# Patient Record
Sex: Female | Born: 1950 | Race: Black or African American | Smoking: Former smoker
Health system: Southern US, Community
[De-identification: ages and names within clinical notes are randomized; demographics above are authoritative.]

## PROBLEM LIST (undated history)

## (undated) DIAGNOSIS — M1990 Unspecified osteoarthritis, unspecified site: Secondary | ICD-10-CM

## (undated) HISTORY — PX: ROTATOR CUFF REPAIR: SHX139

## (undated) HISTORY — PX: JOINT REPLACEMENT: SHX530

## (undated) HISTORY — PX: BUNIONECTOMY: SHX129

## (undated) HISTORY — PX: UTERINE FIBROID SURGERY: SHX826

---

## 2012-09-01 ENCOUNTER — Other Ambulatory Visit: Payer: Self-pay

## 2012-09-01 DIAGNOSIS — Z1231 Encounter for screening mammogram for malignant neoplasm of breast: Secondary | ICD-10-CM

## 2012-09-17 ENCOUNTER — Ambulatory Visit
Admission: RE | Admit: 2012-09-17 | Discharge: 2012-09-17 | Disposition: A | Discharge status: Home / Self Care | Payer: Managed Care, Other (non HMO) | Source: Ambulatory Visit

## 2012-09-17 DIAGNOSIS — Z1231 Encounter for screening mammogram for malignant neoplasm of breast: Secondary | ICD-10-CM

## 2012-11-24 ENCOUNTER — Encounter: Payer: Managed Care, Other (non HMO) | Attending: Family Medicine | Admitting: Dietician

## 2012-11-24 ENCOUNTER — Encounter: Payer: Self-pay | Admitting: Dietician

## 2012-11-24 VITALS — Ht 59.0 in | Wt 170.4 lb

## 2012-11-24 DIAGNOSIS — Z713 Dietary counseling and surveillance: Secondary | ICD-10-CM | POA: Insufficient documentation

## 2012-11-24 DIAGNOSIS — E669 Obesity, unspecified: Secondary | ICD-10-CM | POA: Insufficient documentation

## 2012-11-24 NOTE — Patient Instructions (Addendum)
Consider eat three meals and 2 snacks if needed. Consider having oatmeal, boiled egg, or protein shake before leaving house. Consider bringing a snack for midmorning. Consider doing group exercise classes or exercise with a friend or daughter for motivation.  Consider making meals on the weekend or with daughter when she prepares meals. Use MyPlate as guideline for food portions.

## 2012-11-24 NOTE — Progress Notes (Signed)
  Medical Nutrition Therapy:  Appt start time: 1500 end time:  1600.   Assessment:  Primary concerns today. Concerned that weight has gone up. Moved here two years ago and weight was 140 lbs, which was her weight for a long time. 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 Does a lot of "eating and sleeping" on the weekends she is not working.  Lives at home with daughter and grandson and splits grocery shopping with daughter. Daughter is in the process of losing weight and fixes her own food, though they do not eat together since daughter works out and goes to class at night.   Previously worked in [employer] and had time to work out and watch what she ate. Now, feeling too tired after work to make time for exercise or preparing meals. Watches TV at home after work until bed. Eats food at kitchen table, though grazes instead of eating a specific dinner meal. Eats breakfast and lunch at the hospital and has to taste a lot of the foods she prepares, though sometimes skips meals if feeling too tired to eat.   Discussed making some time at home to prepare meals in advance or with her daughter to bring to work, eating protein, fiber, and lean protein with each meal, eating breakfast at home before work and bringing a snack for midmorning. Also discussed finding motivation for physical activity such as working out with a buddy or trying group exercise classes.   MEDICATIONS: See List   DIETARY INTAKE:  24-hr recall:  B (6 AM): Sausage, bacon, potatoes, and whatever is being served for breakfast with coffeeat the hospital   Snk ( AM): none  L ( PM): anything she can grab, pizza, chicken, vegetables, fish from hospital with water Snk ( PM): none D ( PM): potato chips, cookies, coca cola, sandwich with [place] Snk ( PM): grazes at night from time she gets home at work Beverages: coca cola, water throughout the day, coffee in  morning  Usual physical activity: works as a Financial risk analyst so on feet while working walks 15,000 steps per day on the job, no additional activity, belongs to the gym but does not go  Estimated energy needs: 1400 calories 158 g carbohydrates 105 g protein 39 g fat  Progress Towards Goal(s):  In progress.   Nutritional Diagnosis:  NB-1.1 Food and nutrition-related knowledge deficit As related to meal skipping, lack of structured meals, and energy dense snack choices.  As evidenced by 30 pound weight gain in past two years. .    Intervention:  Nutrition counseling. Plan includes:  Consider eat three meals and 2 snacks if needed. Consider having oatmeal, boiled egg, or protein shake before leaving house. Consider bringing a snack for midmorning. Consider doing group exercise classes or exercise with a friend or daughter for motivation.  Consider making meals on the weekend or with daughter when she prepares meals. Use MyPlate as guideline for food portions.   Handouts given during visit include:  MyPlate (2 handouts)   Group Exercise Class List for [facility] Employees  15 g CHO Snack Ideas  Monitoring/Evaluation:  Dietary intake, exercise, and body weight in 6 week(s).

## 2013-01-07 ENCOUNTER — Ambulatory Visit: Payer: Managed Care, Other (non HMO) | Admitting: *Deleted

## 2013-01-12 ENCOUNTER — Ambulatory Visit: Payer: Managed Care, Other (non HMO) | Admitting: *Deleted

## 2013-03-17 ENCOUNTER — Ambulatory Visit: Payer: Managed Care, Other (non HMO) | Attending: Family Medicine | Admitting: Physical Therapy

## 2013-03-17 DIAGNOSIS — M25669 Stiffness of unspecified knee, not elsewhere classified: Secondary | ICD-10-CM | POA: Insufficient documentation

## 2013-03-17 DIAGNOSIS — IMO0001 Reserved for inherently not codable concepts without codable children: Secondary | ICD-10-CM | POA: Insufficient documentation

## 2013-03-17 DIAGNOSIS — M545 Low back pain, unspecified: Secondary | ICD-10-CM | POA: Insufficient documentation

## 2013-03-30 ENCOUNTER — Ambulatory Visit: Payer: Managed Care, Other (non HMO) | Admitting: Physical Therapy

## 2013-04-05 ENCOUNTER — Ambulatory Visit: Payer: Managed Care, Other (non HMO) | Attending: Family Medicine | Admitting: Physical Therapy

## 2013-04-05 DIAGNOSIS — M25669 Stiffness of unspecified knee, not elsewhere classified: Secondary | ICD-10-CM | POA: Insufficient documentation

## 2013-04-05 DIAGNOSIS — IMO0001 Reserved for inherently not codable concepts without codable children: Secondary | ICD-10-CM | POA: Insufficient documentation

## 2013-04-05 DIAGNOSIS — M545 Low back pain, unspecified: Secondary | ICD-10-CM | POA: Insufficient documentation

## 2013-04-07 ENCOUNTER — Ambulatory Visit: Payer: Managed Care, Other (non HMO) | Admitting: Physical Therapy

## 2013-04-12 ENCOUNTER — Ambulatory Visit: Payer: Managed Care, Other (non HMO) | Admitting: Physical Therapy

## 2013-04-14 ENCOUNTER — Ambulatory Visit: Payer: Managed Care, Other (non HMO) | Admitting: Physical Therapy

## 2013-04-19 ENCOUNTER — Ambulatory Visit: Payer: Managed Care, Other (non HMO) | Admitting: Physical Therapy

## 2013-04-21 ENCOUNTER — Ambulatory Visit: Payer: Managed Care, Other (non HMO) | Admitting: Physical Therapy

## 2013-04-26 ENCOUNTER — Ambulatory Visit: Payer: Managed Care, Other (non HMO) | Admitting: Physical Therapy

## 2013-09-01 ENCOUNTER — Other Ambulatory Visit (HOSPITAL_COMMUNITY)
Admission: RE | Admit: 2013-09-01 | Discharge: 2013-09-01 | Disposition: A | Discharge status: Home / Self Care | Payer: Managed Care, Other (non HMO) | Source: Ambulatory Visit | Attending: Obstetrics and Gynecology | Admitting: Obstetrics and Gynecology

## 2013-09-01 ENCOUNTER — Other Ambulatory Visit: Payer: Self-pay | Admitting: Obstetrics and Gynecology

## 2013-09-01 DIAGNOSIS — Z1151 Encounter for screening for human papillomavirus (HPV): Secondary | ICD-10-CM | POA: Insufficient documentation

## 2013-09-01 DIAGNOSIS — Z01419 Encounter for gynecological examination (general) (routine) without abnormal findings: Secondary | ICD-10-CM | POA: Insufficient documentation

## 2013-10-26 ENCOUNTER — Other Ambulatory Visit: Payer: Self-pay

## 2013-10-26 DIAGNOSIS — Z1231 Encounter for screening mammogram for malignant neoplasm of breast: Secondary | ICD-10-CM

## 2013-10-29 ENCOUNTER — Ambulatory Visit
Admission: RE | Admit: 2013-10-29 | Discharge: 2013-10-29 | Disposition: A | Discharge status: Home / Self Care | Payer: Managed Care, Other (non HMO) | Source: Ambulatory Visit

## 2013-10-29 DIAGNOSIS — Z1231 Encounter for screening mammogram for malignant neoplasm of breast: Secondary | ICD-10-CM

## 2014-09-02 ENCOUNTER — Other Ambulatory Visit (HOSPITAL_COMMUNITY)
Admission: RE | Admit: 2014-09-02 | Discharge: 2014-09-02 | Disposition: A | Discharge status: Home / Self Care | Payer: Managed Care, Other (non HMO) | Source: Ambulatory Visit | Attending: Obstetrics and Gynecology | Admitting: Obstetrics and Gynecology

## 2014-09-02 ENCOUNTER — Other Ambulatory Visit: Payer: Self-pay | Admitting: Obstetrics and Gynecology

## 2014-09-02 DIAGNOSIS — Z01419 Encounter for gynecological examination (general) (routine) without abnormal findings: Secondary | ICD-10-CM | POA: Insufficient documentation

## 2014-09-06 LAB — CYTOLOGY - PAP

## 2014-10-24 ENCOUNTER — Other Ambulatory Visit: Payer: Self-pay

## 2014-10-24 DIAGNOSIS — Z1231 Encounter for screening mammogram for malignant neoplasm of breast: Secondary | ICD-10-CM

## 2014-11-02 ENCOUNTER — Ambulatory Visit
Admission: RE | Admit: 2014-11-02 | Discharge: 2014-11-02 | Disposition: A | Discharge status: Home / Self Care | Payer: Managed Care, Other (non HMO) | Source: Ambulatory Visit

## 2014-11-02 DIAGNOSIS — Z1231 Encounter for screening mammogram for malignant neoplasm of breast: Secondary | ICD-10-CM

## 2016-01-30 ENCOUNTER — Other Ambulatory Visit: Payer: Self-pay | Admitting: Family Medicine

## 2016-01-30 DIAGNOSIS — Z1231 Encounter for screening mammogram for malignant neoplasm of breast: Secondary | ICD-10-CM

## 2016-02-07 ENCOUNTER — Ambulatory Visit
Admission: RE | Admit: 2016-02-07 | Discharge: 2016-02-07 | Disposition: A | Discharge status: Home / Self Care | Payer: Managed Care, Other (non HMO) | Source: Ambulatory Visit | Attending: Family Medicine | Admitting: Family Medicine

## 2016-02-07 DIAGNOSIS — Z1231 Encounter for screening mammogram for malignant neoplasm of breast: Secondary | ICD-10-CM

## 2016-02-29 ENCOUNTER — Ambulatory Visit: Payer: Self-pay | Admitting: Orthopedic Surgery

## 2016-03-05 ENCOUNTER — Ambulatory Visit: Payer: Self-pay | Admitting: Orthopedic Surgery

## 2016-03-05 NOTE — H&P (Signed)
TOTAL HIP ADMISSION H&P  Patient is admitted for right total hip arthroplasty.  Subjective:  Chief Complaint: right hip pain  HPI: Paula Hardy, 65 y.o. female, has a history of pain and functional disability in the right hip(s) due to arthritis and patient has failed non-surgical conservative treatments for greater than 12 weeks to include NSAID's and/or analgesics, flexibility and strengthening excercises, use of assistive devices, weight reduction as appropriate and activity modification.  Onset of symptoms was gradual starting 1 years ago with rapidlly worsening course since that time.The patient noted no past surgery on the right hip(s).  Patient currently rates pain in the right hip at 10 out of 10 with activity. Patient has night pain, worsening of pain with activity and weight bearing, pain that interfers with activities of daily living and pain with passive range of motion. Patient has evidence of subchondral cysts, subchondral sclerosis, periarticular osteophytes and joint space narrowing by imaging studies. This condition presents safety issues increasing the risk of falls. There is no current active infection.  There are no active problems to display for this patient.  Past Medical History:  Diagnosis Date  . Fibromyalgia     No past surgical history on file.   (Not in a hospital admission) No Known Allergies  Social History  Substance Use Topics  . Smoking status: Not on file  . Smokeless tobacco: Not on file  . Alcohol use Not on file    No family history on file.   Review of Systems  Constitutional: Negative.   HENT: Negative.   Eyes: Negative.   Respiratory: Negative.   Cardiovascular: Negative.   Gastrointestinal: Negative.   Genitourinary: Negative.   Musculoskeletal: Positive for back pain and joint pain.  Skin: Negative.   Neurological: Negative.   Endo/Heme/Allergies: Negative.   Psychiatric/Behavioral: Negative.     Objective:  Physical Exam   Vitals reviewed. Constitutional: She is oriented to person, place, and time. She appears well-developed and well-nourished.  HENT:  Head: Normocephalic and atraumatic.  Eyes: Conjunctivae and EOM are normal. Pupils are equal, round, and reactive to light.  Neck: Normal range of motion. Neck supple.  Cardiovascular: Normal rate and regular rhythm.   Respiratory: Effort normal. No respiratory distress.  GI: Soft. She exhibits no distension.  Genitourinary:  Genitourinary Comments: deferred  Musculoskeletal:       Right hip: She exhibits decreased range of motion, decreased strength and bony tenderness.  Neurological: She is alert and oriented to person, place, and time. She has normal reflexes.  Skin: Skin is warm and dry.  Psychiatric: She has a normal mood and affect. Her behavior is normal. Judgment and thought content normal.    Vital signs in last 24 hours: @VSRANGES@  Labs:   Estimated body mass index is 34.42 kg/m as calculated from the following:   Height as of 11/24/12: 4' 11" (1.499 m).   Weight as of 11/24/12: 77.3 kg (170 lb 6.4 oz).   Imaging Review Plain radiographs demonstrate severe degenerative joint disease of the right hip(s). The bone quality appears to be adequate for age and reported activity level.  Assessment/Plan:  End stage arthritis, right hip(s)  The patient history, physical examination, clinical judgement of the provider and imaging studies are consistent with end stage degenerative joint disease of the right hip(s) and total hip arthroplasty is deemed medically necessary. The treatment options including medical management, injection therapy, arthroscopy and arthroplasty were discussed at length. The risks and benefits of total hip arthroplasty were presented   and reviewed. The risks due to aseptic loosening, infection, stiffness, dislocation/subluxation,  thromboembolic complications and other imponderables were discussed.  The patient acknowledged the  explanation, agreed to proceed with the plan and consent was signed. Patient is being admitted for inpatient treatment for surgery, pain control, PT, OT, prophylactic antibiotics, VTE prophylaxis, progressive ambulation and ADL's and discharge planning.The patient is planning to be discharged home with home health services

## 2016-03-12 NOTE — Patient Instructions (Signed)
Paula DavidsonCarlis Hardy  03/12/2016   Your procedure is scheduled on: 03/21/2016    [facility]  [facility] to 3rd floor to  Short Stay Center at   1045 AM.  Call this number if you have problems the morning of surgery [phone]   Remember: ONLY 1 PERSON MAY GO WITH YOU TO SHORT STAY TO GET  READY MORNING OF YOUR SURGERY.  Do not eat food or drink liquids :After Midnight.     Take these medicines the morning of surgery with A SIP OF WATER: none                                 You may not have any metal on your body including hair pins and              piercings  Do not wear jewelry, make-up, lotions, powders or perfumes, deodorant             Do not wear nail polish.  Do not shave  48 hours prior to surgery.              Men may shave face and neck.   Do not bring valuables to the hospital. [place] IS NOT             RESPONSIBLE   FOR VALUABLES.  Contacts, dentures or bridgework may not be worn into surgery.  Leave suitcase in the car. After surgery it may be brought to your room.         Special Instructions: coughing and deep breathing exercises,leg exercises               Please read over the following fact sheets you were given: _____________________________________________________________________             [facility] - Preparing for [facility] Before surgery, you can play an important role.  Because skin is not sterile, your skin needs to be as free of germs as possible.  You can reduce the number of germs on your skin by washing with CHG (chlorahexidine gluconate) soap before surgery.  CHG is an antiseptic cleaner which kills germs and bonds with the skin to continue killing germs even after washing. Please DO NOT use if you have an allergy to CHG or antibacterial soaps.  If your skin becomes reddened/irritated stop using the CHG and inform your nurse when you arrive at Short Stay. Do not shave (including legs and  underarms) for at least 48 hours prior to the first CHG shower.  You may shave your face/neck. Please follow these instructions carefully:  1.  Shower with CHG Soap the night before surgery and the  morning of Surgery.  2.  If you choose to wash your hair, wash your hair first as usual with your  normal  shampoo.  3.  After you shampoo, rinse your hair and body thoroughly to remove the  shampoo.                           4.  Use CHG as you would any other liquid soap.  You can apply chg directly  to the skin and wash  Gently with a scrungie or clean washcloth.  5.  Apply the CHG Soap to your body ONLY FROM THE NECK DOWN.   Do not use on face/ open                           Wound or open sores. Avoid contact with eyes, ears mouth and genitals (private parts).                       Wash face,  Genitals (private parts) with your normal soap.             6.  Wash thoroughly, paying special attention to the area where your surgery  will be performed.  7.  Thoroughly rinse your body with warm water from the neck down.  8.  DO NOT shower/wash with your normal soap after using and rinsing off  the CHG Soap.                9.  Pat yourself dry with a clean towel.            10.  Wear clean pajamas.            11.  Place clean sheets on your bed the night of your first shower and do not  sleep with pets. Day of Surgery : Do not apply any lotions/deodorants the morning of surgery.  Please wear clean clothes to the hospital/surgery center.  FAILURE TO FOLLOW THESE INSTRUCTIONS MAY RESULT IN THE CANCELLATION OF YOUR SURGERY PATIENT SIGNATURE_________________________________  NURSE SIGNATURE__________________________________  ________________________________________________________________________  WHAT IS A BLOOD TRANSFUSION? Blood Transfusion Information  A transfusion is the replacement of blood or some of its parts. Blood is made up of multiple cells which provide different  functions.  Red blood cells carry oxygen and are used for blood loss replacement.  White blood cells fight against infection.  Platelets control bleeding.  Plasma helps clot blood.  Other blood products are available for specialized needs, such as hemophilia or other clotting disorders. BEFORE THE TRANSFUSION  Who gives blood for transfusions?   Healthy volunteers who are fully evaluated to make sure their blood is safe. This is blood bank blood. Transfusion therapy is the safest it has ever been in the practice of medicine. Before blood is taken from a donor, a complete history is taken to make sure that person has no history of diseases nor engages in risky social behavior (examples are intravenous drug use or sexual activity with multiple partners). The donor's travel history is screened to minimize risk of transmitting infections, such as malaria. The donated blood is tested for signs of infectious diseases, such as HIV and hepatitis. The blood is then tested to be sure it is compatible with you in order to minimize the chance of a transfusion reaction. If you or a relative donates blood, this is often done in anticipation of surgery and is not appropriate for emergency situations. It takes many days to process the donated blood. RISKS AND COMPLICATIONS Although transfusion therapy is very safe and saves many lives, the main dangers of transfusion include:   Getting an infectious disease.  Developing a transfusion reaction. This is an allergic reaction to something in the blood you were given. Every precaution is taken to prevent this. The decision to have a blood transfusion has been considered carefully by your caregiver before blood is given. Blood is not given unless the benefits outweigh  the risks. AFTER THE TRANSFUSION  Right after receiving a blood transfusion, you will usually feel much better and more energetic. This is especially true if your red blood cells have gotten low  (anemic). The transfusion raises the level of the red blood cells which carry oxygen, and this usually causes an energy increase.  The nurse administering the transfusion will monitor you carefully for complications. HOME CARE INSTRUCTIONS  No special instructions are needed after a transfusion. You may find your energy is better. Speak with your caregiver about any limitations on activity for underlying diseases you may have. SEEK MEDICAL CARE IF:   Your condition is not improving after your transfusion.  You develop redness or irritation at the intravenous (IV) site. SEEK IMMEDIATE MEDICAL CARE IF:  Any of the following symptoms occur over the next 12 hours:  Shaking chills.  You have a temperature by mouth above 102 F (38.9 C), not controlled by medicine.  Chest, back, or muscle pain.  People around you feel you are not acting correctly or are confused.  Shortness of breath or difficulty breathing.  Dizziness and fainting.  You get a rash or develop hives.  You have a decrease in urine output.  Your urine turns a dark color or changes to pink, red, or brown. Any of the following symptoms occur over the next 10 days:  You have a temperature by mouth above 102 F (38.9 C), not controlled by medicine.  Shortness of breath.  Weakness after normal activity.  The white part of the eye turns yellow (jaundice).  You have a decrease in the amount of urine or are urinating less often.  Your urine turns a dark color or changes to pink, red, or brown. Document Released: 04/19/2000 Document Revised: 07/15/2011 Document Reviewed: 12/07/2007 ExitCare Patient Information 2014 [place].  _______________________________________________________________________  Incentive Spirometer  An incentive spirometer is a tool that can help keep your lungs clear and active. This tool measures how well you are filling your lungs with each breath. Taking long deep breaths may help  reverse or decrease the chance of developing breathing (pulmonary) problems (especially infection) following:  A long period of time when you are unable to move or be active. BEFORE THE PROCEDURE   If the spirometer includes an indicator to show your best effort, your nurse or respiratory therapist will set it to a desired goal.  If possible, sit up straight or lean slightly forward. Try not to slouch.  Hold the incentive spirometer in an upright position. INSTRUCTIONS FOR USE  1. Sit on the edge of your bed if possible, or sit up as far as you can in bed or on a chair. 2. Hold the incentive spirometer in an upright position. 3. Breathe out normally. 4. Place the mouthpiece in your mouth and seal your lips tightly around it. 5. Breathe in slowly and as deeply as possible, raising the piston or the ball toward the top of the column. 6. Hold your breath for 3-5 seconds or for as long as possible. Allow the piston or ball to fall to the bottom of the column. 7. Remove the mouthpiece from your mouth and breathe out normally. 8. Rest for a few seconds and repeat Steps 1 through 7 at least 10 times every 1-2 hours when you are awake. Take your time and take a few normal breaths between deep breaths. 9. The spirometer may include an indicator to show your best effort. Use the indicator as a goal to work  toward during each repetition. 10. After each set of 10 deep breaths, practice coughing to be sure your lungs are clear. If you have an incision (the cut made at the time of surgery), support your incision when coughing by placing a pillow or rolled up towels firmly against it. Once you are able to get out of bed, walk around indoors and cough well. You may stop using the incentive spirometer when instructed by your caregiver.  RISKS AND COMPLICATIONS  Take your time so you do not get dizzy or light-headed.  If you are in pain, you may need to take or ask for pain medication before doing incentive  spirometry. It is harder to take a deep breath if you are having pain. AFTER USE  Rest and breathe slowly and easily.  It can be helpful to keep track of a log of your progress. Your caregiver can provide you with a simple table to help with this. If you are using the spirometer at home, follow these instructions: [place] IF:   You are having difficultly using the spirometer.  You have trouble using the spirometer as often as instructed.  Your pain medication is not giving enough relief while using the spirometer.  You develop fever of 100.5 F (38.1 C) or higher. SEEK IMMEDIATE MEDICAL CARE IF:   You cough up bloody sputum that had not been present before.  You develop fever of 102 F (38.9 C) or greater.  You develop worsening pain at or near the incision site. MAKE SURE YOU:   Understand these instructions.  Will watch your condition.  Will get help right away if you are not doing well or get worse. Document Released: 09/02/2006 Document Revised: 07/15/2011 Document Reviewed: 11/03/2006 [facility] Patient Information 2014 [place], [place].   ________________________________________________________________________

## 2016-03-13 ENCOUNTER — Encounter (HOSPITAL_COMMUNITY): Payer: Self-pay | Admitting: *Deleted

## 2016-03-13 ENCOUNTER — Encounter (HOSPITAL_COMMUNITY)
Admission: RE | Admit: 2016-03-13 | Discharge: 2016-03-13 | Disposition: A | Discharge status: Home / Self Care | Payer: Managed Care, Other (non HMO) | Source: Ambulatory Visit | Attending: Orthopedic Surgery | Admitting: Orthopedic Surgery

## 2016-03-13 DIAGNOSIS — Z01810 Encounter for preprocedural cardiovascular examination: Secondary | ICD-10-CM | POA: Diagnosis not present

## 2016-03-13 DIAGNOSIS — Z01812 Encounter for preprocedural laboratory examination: Secondary | ICD-10-CM | POA: Diagnosis present

## 2016-03-13 HISTORY — DX: Unspecified osteoarthritis, unspecified site: M19.90

## 2016-03-13 LAB — BASIC METABOLIC PANEL
Anion gap: 6 (ref 5–15)
BUN: 14 mg/dL (ref 6–20)
CO2: 28 mmol/L (ref 22–32)
CREATININE: 0.65 mg/dL (ref 0.44–1.00)
Calcium: 9.1 mg/dL (ref 8.9–10.3)
Chloride: 106 mmol/L (ref 101–111)
GFR calc Af Amer: >60 mL/min (ref >60)
GLUCOSE: 91 mg/dL (ref 65–99)
Potassium: 4.5 mmol/L (ref 3.5–5.1)
SODIUM: 140 mmol/L (ref 135–145)

## 2016-03-13 LAB — CBC
HCT: 41.8 % (ref 36.0–46.0)
Hemoglobin: 13.5 g/dL (ref 12.0–15.0)
MCH: 28 pg (ref 26.0–34.0)
MCHC: 32.3 g/dL (ref 30.0–36.0)
MCV: 86.5 fL (ref 78.0–100.0)
PLATELETS: 354 10*3/uL (ref 150–400)
RBC: 4.83 MIL/uL (ref 3.87–5.11)
RDW: 13.8 % (ref 11.5–15.5)
WBC: 7.9 10*3/uL (ref 4.0–10.5)

## 2016-03-13 LAB — ABO/RH: ABO/RH(D): O POS

## 2016-03-13 LAB — SURGICAL PCR SCREEN
MRSA, PCR: NEGATIVE
Staphylococcus aureus: NEGATIVE

## 2016-03-13 NOTE — Progress Notes (Addendum)
At preop appointment noted on dinemapp pulse - 30 .  Patient voiced no complaints.  B/P - 157/63.  Sat- 100.  Manual radial pulse noted to be irregular at 56 with / skipping beats.  Patient denies any chest pain or shortness of breath.  Patient stated she had EKG done 01/2016 at PCP- Dr Juluis RainierElizabeth Barnes who cleared her for surgery.  Clearance from Dr Zachery DauerBarnes on chart dated 01/2016.  Called and requested EKG from Dr Zachery DauerBarnes.  EKG done at preop.  EKgs shown to Dr Sampson GoonFitzgerald ( anesthesia).  Made anesthesia aware of above along with medical history.  Dr Sampson GoonFitzgerald stated patient will need cardiac clearance prior to surgery  Patient made aware of this.  Patient also verbalized understanding that if she has any chest pain or shortness of breath to go to Emergency Room.  I informed patient that I would be [facility] and make them aware.  I called Halford DecampLaurie Faust and made aware of above.  EKG from 01/2016 and EKG from 03/13/16 fax[facility]aur[facility]  Halford DecampLaurie Faust stated she would inform Dr Linna CapriceSwinteck.  Confirmation fax received.

## 2016-03-13 NOTE — Progress Notes (Signed)
Clearance- Dr Zachery DauerBarnes- 01/2016 on chart along with EKG from 01/2016 on chart.   [facility],DDS 02/26/2016 on chart.

## 2016-03-14 NOTE — Progress Notes (Signed)
Final EKG done 03/13/16- [facility]  

## 2016-03-15 NOTE — Progress Notes (Addendum)
Cardiac Clearance on chart- dated 03/15/16 from Ddr Ganji. Requested LOV note and EKG from visit of 03/15/16 at Dr Jacinto HalimGanji.  They are to fax.

## 2016-03-19 NOTE — Progress Notes (Signed)
Received and placed on chart Clearance dated 03/15/2016 on chart EKG done 11/10/176 and office visit of 03/15/2016 with Dr Jacinto HalimGanji.

## 2016-03-21 ENCOUNTER — Inpatient Hospital Stay (HOSPITAL_COMMUNITY): Payer: Managed Care, Other (non HMO)

## 2016-03-21 ENCOUNTER — Encounter (HOSPITAL_COMMUNITY): Payer: Self-pay | Admitting: *Deleted

## 2016-03-21 ENCOUNTER — Encounter (HOSPITAL_COMMUNITY)
Admission: RE | Disposition: A | Discharge status: Home Health | Payer: Self-pay | Source: Ambulatory Visit | Attending: Orthopedic Surgery

## 2016-03-21 ENCOUNTER — Inpatient Hospital Stay (HOSPITAL_COMMUNITY)
Admission: RE | Admit: 2016-03-21 | Discharge: 2016-03-22 | DRG: 470 | Disposition: A | Discharge status: Home Health | Payer: Managed Care, Other (non HMO) | Source: Ambulatory Visit | Attending: Orthopedic Surgery | Admitting: Orthopedic Surgery

## 2016-03-21 ENCOUNTER — Inpatient Hospital Stay (HOSPITAL_COMMUNITY): Payer: Managed Care, Other (non HMO) | Admitting: Certified Registered Nurse Anesthetist

## 2016-03-21 DIAGNOSIS — Z09 Encounter for follow-up examination after completed treatment for conditions other than malignant neoplasm: Secondary | ICD-10-CM

## 2016-03-21 DIAGNOSIS — M25551 Pain in right hip: Secondary | ICD-10-CM | POA: Diagnosis present

## 2016-03-21 DIAGNOSIS — M1611 Unilateral primary osteoarthritis, right hip: Principal | ICD-10-CM | POA: Diagnosis present

## 2016-03-21 DIAGNOSIS — Z87891 Personal history of nicotine dependence: Secondary | ICD-10-CM | POA: Diagnosis not present

## 2016-03-21 DIAGNOSIS — M25559 Pain in unspecified hip: Secondary | ICD-10-CM

## 2016-03-21 DIAGNOSIS — M797 Fibromyalgia: Secondary | ICD-10-CM | POA: Diagnosis present

## 2016-03-21 HISTORY — PX: TOTAL HIP ARTHROPLASTY: SHX124

## 2016-03-21 LAB — TYPE AND SCREEN
ABO/RH(D): O POS
ANTIBODY SCREEN: NEGATIVE

## 2016-03-21 SURGERY — ARTHROPLASTY, HIP, TOTAL, ANTERIOR APPROACH
Anesthesia: Monitor Anesthesia Care | Site: Hip | Laterality: Right

## 2016-03-21 MED ORDER — PROMETHAZINE HCL 25 MG/ML IJ SOLN: 6.2500 mg | INTRAMUSCULAR | Status: DC | PRN

## 2016-03-21 MED ORDER — DIPHENHYDRAMINE HCL 12.5 MG/5ML PO ELIX: 12.5000 mg | ORAL_SOLUTION | ORAL | Status: DC | PRN

## 2016-03-21 MED ORDER — ISOPROPYL ALCOHOL 70 % SOLN
Status: AC
  Filled 2016-03-21: qty 480

## 2016-03-21 MED ORDER — ISOPROPYL ALCOHOL 70 % SOLN
Status: DC | PRN
  Administered 2016-03-21: 1 via TOPICAL

## 2016-03-21 MED ORDER — WATER FOR IRRIGATION, STERILE IR SOLN
Status: DC | PRN
  Administered 2016-03-21: 3000 mL

## 2016-03-21 MED ORDER — PHENYLEPHRINE HCL 10 MG/ML IJ SOLN
INTRAMUSCULAR | Status: AC
  Filled 2016-03-21: qty 1

## 2016-03-21 MED ORDER — CEFAZOLIN IN D5W 1 GM/50ML IV SOLN
1.0000 g | Freq: Four times a day (QID) | INTRAVENOUS | Status: AC
  Administered 2016-03-21 – 2016-03-22 (×2): 1 g via INTRAVENOUS
  Filled 2016-03-21 (×2): qty 50

## 2016-03-21 MED ORDER — POVIDONE-IODINE 10 % EX SOLN
CUTANEOUS | Status: DC | PRN
  Administered 2016-03-21: 1 via TOPICAL

## 2016-03-21 MED ORDER — BUPIVACAINE HCL (PF) 0.25 % IJ SOLN
INTRAMUSCULAR | Status: AC
  Filled 2016-03-21: qty 30

## 2016-03-21 MED ORDER — ACETAMINOPHEN 325 MG PO TABS: 650.0000 mg | ORAL_TABLET | Freq: Four times a day (QID) | ORAL | Status: DC | PRN

## 2016-03-21 MED ORDER — PHENYLEPHRINE HCL 10 MG/ML IJ SOLN
INTRAVENOUS | Status: DC | PRN
  Administered 2016-03-21: 50 ug/min via INTRAVENOUS

## 2016-03-21 MED ORDER — DEXTROSE 5 % IV SOLN
500.0000 mg | Freq: Four times a day (QID) | INTRAVENOUS | Status: DC | PRN
  Administered 2016-03-21: 500 mg via INTRAVENOUS
  Filled 2016-03-21: qty 550
  Filled 2016-03-21: qty 5

## 2016-03-21 MED ORDER — MIDAZOLAM HCL 2 MG/2ML IJ SOLN
INTRAMUSCULAR | Status: AC
  Filled 2016-03-21: qty 2

## 2016-03-21 MED ORDER — POVIDONE-IODINE 10 % EX SWAB: 2.0000 "application " | Freq: Once | CUTANEOUS | Status: DC

## 2016-03-21 MED ORDER — BUPIVACAINE HCL (PF) 0.5 % IJ SOLN
INTRAMUSCULAR | Status: DC | PRN
  Administered 2016-03-21: 3 mL via INTRATHECAL

## 2016-03-21 MED ORDER — ONDANSETRON HCL 4 MG/2ML IJ SOLN
INTRAMUSCULAR | Status: AC
  Filled 2016-03-21: qty 2

## 2016-03-21 MED ORDER — VITAMIN D3 25 MCG (1000 UNIT) PO TABS
1000.0000 [IU] | ORAL_TABLET | Freq: Every day | ORAL | Status: DC
  Administered 2016-03-22: 1000 [IU] via ORAL
  Filled 2016-03-21: qty 1

## 2016-03-21 MED ORDER — HYDROMORPHONE HCL 1 MG/ML IJ SOLN
INTRAMUSCULAR | Status: AC
  Administered 2016-03-21: 0.5 mg via INTRAVENOUS
  Filled 2016-03-21: qty 1

## 2016-03-21 MED ORDER — ACETAMINOPHEN 10 MG/ML IV SOLN
1000.0000 mg | INTRAVENOUS | Status: AC
  Administered 2016-03-21: 1000 mg via INTRAVENOUS
  Filled 2016-03-21: qty 100

## 2016-03-21 MED ORDER — PROPOFOL 10 MG/ML IV BOLUS
INTRAVENOUS | Status: DC | PRN
  Administered 2016-03-21 (×4): 10 mg via INTRAVENOUS

## 2016-03-21 MED ORDER — DEXAMETHASONE SODIUM PHOSPHATE 10 MG/ML IJ SOLN
10.0000 mg | Freq: Once | INTRAMUSCULAR | Status: AC
  Administered 2016-03-22: 10 mg via INTRAVENOUS
  Filled 2016-03-21: qty 1

## 2016-03-21 MED ORDER — CEFAZOLIN SODIUM-DEXTROSE 2-4 GM/100ML-% IV SOLN
2.0000 g | INTRAVENOUS | Status: AC
  Administered 2016-03-21: 2 g via INTRAVENOUS

## 2016-03-21 MED ORDER — DOCUSATE SODIUM 100 MG PO CAPS
100.0000 mg | ORAL_CAPSULE | Freq: Two times a day (BID) | ORAL | Status: DC
  Administered 2016-03-21 – 2016-03-22 (×2): 100 mg via ORAL
  Filled 2016-03-21 (×2): qty 1

## 2016-03-21 MED ORDER — ONDANSETRON HCL 4 MG/2ML IJ SOLN: 4.0000 mg | Freq: Four times a day (QID) | INTRAMUSCULAR | Status: DC | PRN

## 2016-03-21 MED ORDER — PHENYLEPHRINE 40 MCG/ML (10ML) SYRINGE FOR IV PUSH (FOR BLOOD PRESSURE SUPPORT)
PREFILLED_SYRINGE | INTRAVENOUS | Status: DC | PRN
  Administered 2016-03-21 (×6): 80 ug via INTRAVENOUS

## 2016-03-21 MED ORDER — CHLORHEXIDINE GLUCONATE 4 % EX LIQD: 60.0000 mL | Freq: Once | CUTANEOUS | Status: DC

## 2016-03-21 MED ORDER — 0.9 % SODIUM CHLORIDE (POUR BTL) OPTIME: TOPICAL | Status: DC | PRN

## 2016-03-21 MED ORDER — EPHEDRINE 5 MG/ML INJ
INTRAVENOUS | Status: AC
  Filled 2016-03-21: qty 10

## 2016-03-21 MED ORDER — SODIUM CHLORIDE 0.9 % IJ SOLN
INTRAMUSCULAR | Status: DC | PRN
  Administered 2016-03-21: 29 mL

## 2016-03-21 MED ORDER — HYDROMORPHONE HCL 1 MG/ML IJ SOLN
0.2500 mg | INTRAMUSCULAR | Status: DC | PRN
  Administered 2016-03-21 (×2): 0.5 mg via INTRAVENOUS

## 2016-03-21 MED ORDER — FENTANYL CITRATE (PF) 100 MCG/2ML IJ SOLN
INTRAMUSCULAR | Status: DC | PRN
  Administered 2016-03-21 (×2): 25 ug via INTRAVENOUS
  Administered 2016-03-21: 50 ug via INTRAVENOUS

## 2016-03-21 MED ORDER — MIDAZOLAM HCL 5 MG/5ML IJ SOLN
INTRAMUSCULAR | Status: DC | PRN
  Administered 2016-03-21 (×2): 1 mg via INTRAVENOUS

## 2016-03-21 MED ORDER — ACETAMINOPHEN 10 MG/ML IV SOLN
INTRAVENOUS | Status: AC
  Filled 2016-03-21: qty 100

## 2016-03-21 MED ORDER — TRANEXAMIC ACID 1000 MG/10ML IV SOLN
1000.0000 mg | Freq: Once | INTRAVENOUS | Status: AC
  Administered 2016-03-21: 1000 mg via INTRAVENOUS
  Filled 2016-03-21: qty 10

## 2016-03-21 MED ORDER — CEFAZOLIN SODIUM-DEXTROSE 2-4 GM/100ML-% IV SOLN
INTRAVENOUS | Status: AC
  Filled 2016-03-21: qty 100

## 2016-03-21 MED ORDER — KETOROLAC TROMETHAMINE 30 MG/ML IJ SOLN
INTRAMUSCULAR | Status: AC
  Filled 2016-03-21: qty 1

## 2016-03-21 MED ORDER — PROPOFOL 10 MG/ML IV BOLUS
INTRAVENOUS | Status: AC
  Filled 2016-03-21: qty 20

## 2016-03-21 MED ORDER — SODIUM CHLORIDE 0.9 % IR SOLN
Status: DC | PRN
  Administered 2016-03-21: 1000 mL

## 2016-03-21 MED ORDER — BUPIVACAINE HCL (PF) 0.25 % IJ SOLN
INTRAMUSCULAR | Status: DC | PRN
  Administered 2016-03-21: 30 mL

## 2016-03-21 MED ORDER — KETOROLAC TROMETHAMINE 30 MG/ML IJ SOLN
INTRAMUSCULAR | Status: DC | PRN
  Administered 2016-03-21: 30 mg

## 2016-03-21 MED ORDER — SODIUM CHLORIDE 0.9 % IV SOLN
INTRAVENOUS | Status: DC
  Administered 2016-03-21 – 2016-03-22 (×2): via INTRAVENOUS

## 2016-03-21 MED ORDER — PHENYLEPHRINE 40 MCG/ML (10ML) SYRINGE FOR IV PUSH (FOR BLOOD PRESSURE SUPPORT)
PREFILLED_SYRINGE | INTRAVENOUS | Status: AC
  Filled 2016-03-21: qty 10

## 2016-03-21 MED ORDER — ONDANSETRON HCL 4 MG PO TABS: 4.0000 mg | ORAL_TABLET | Freq: Four times a day (QID) | ORAL | Status: DC | PRN

## 2016-03-21 MED ORDER — METHOCARBAMOL 500 MG PO TABS
500.0000 mg | ORAL_TABLET | Freq: Four times a day (QID) | ORAL | Status: DC | PRN
  Administered 2016-03-22 (×2): 500 mg via ORAL
  Filled 2016-03-21 (×2): qty 1

## 2016-03-21 MED ORDER — BUPIVACAINE HCL (PF) 0.5 % IJ SOLN
INTRAMUSCULAR | Status: AC
  Filled 2016-03-21: qty 30

## 2016-03-21 MED ORDER — METOCLOPRAMIDE HCL 5 MG PO TABS: 5.0000 mg | ORAL_TABLET | Freq: Three times a day (TID) | ORAL | Status: DC | PRN

## 2016-03-21 MED ORDER — PROPOFOL 500 MG/50ML IV EMUL
INTRAVENOUS | Status: DC | PRN
  Administered 2016-03-21: 50 ug/kg/min via INTRAVENOUS

## 2016-03-21 MED ORDER — HYDROGEN PEROXIDE 3 % EX SOLN
CUTANEOUS | Status: AC
  Filled 2016-03-21: qty 473

## 2016-03-21 MED ORDER — HYDROMORPHONE HCL 1 MG/ML IJ SOLN
0.5000 mg | INTRAMUSCULAR | Status: DC | PRN
  Administered 2016-03-21 – 2016-03-22 (×3): 0.5 mg via INTRAVENOUS
  Filled 2016-03-21 (×3): qty 1

## 2016-03-21 MED ORDER — PROPOFOL 10 MG/ML IV BOLUS
INTRAVENOUS | Status: AC
  Filled 2016-03-21: qty 60

## 2016-03-21 MED ORDER — SODIUM CHLORIDE 0.9 % IR SOLN
Status: DC | PRN
Start: 2016-03-21 — End: 2016-03-21
  Administered 2016-03-21: 500 mL

## 2016-03-21 MED ORDER — SODIUM CHLORIDE 0.9 % IJ SOLN
INTRAMUSCULAR | Status: AC
  Filled 2016-03-21: qty 50

## 2016-03-21 MED ORDER — POLYETHYLENE GLYCOL 3350 17 G PO PACK: 17.0000 g | PACK | Freq: Every day | ORAL | Status: DC | PRN

## 2016-03-21 MED ORDER — HYDROGEN PEROXIDE 3 % EX SOLN
CUTANEOUS | Status: DC | PRN
  Administered 2016-03-21: 1 via TOPICAL

## 2016-03-21 MED ORDER — LACTATED RINGERS IV SOLN
INTRAVENOUS | Status: DC
  Administered 2016-03-21 (×3): via INTRAVENOUS

## 2016-03-21 MED ORDER — TRANEXAMIC ACID 1000 MG/10ML IV SOLN
1000.0000 mg | INTRAVENOUS | Status: AC
  Administered 2016-03-21: 1000 mg via INTRAVENOUS
  Filled 2016-03-21: qty 1100

## 2016-03-21 MED ORDER — MENTHOL 3 MG MT LOZG: 1.0000 | LOZENGE | OROMUCOSAL | Status: DC | PRN

## 2016-03-21 MED ORDER — KETOROLAC TROMETHAMINE 15 MG/ML IJ SOLN
15.0000 mg | Freq: Four times a day (QID) | INTRAMUSCULAR | Status: AC
  Administered 2016-03-21 – 2016-03-22 (×4): 15 mg via INTRAVENOUS
  Filled 2016-03-21 (×4): qty 1

## 2016-03-21 MED ORDER — HYDROCODONE-ACETAMINOPHEN 5-325 MG PO TABS
1.0000 | ORAL_TABLET | ORAL | Status: DC | PRN
  Administered 2016-03-21 – 2016-03-22 (×5): 2 via ORAL
  Filled 2016-03-21 (×6): qty 2

## 2016-03-21 MED ORDER — ACETAMINOPHEN 650 MG RE SUPP: 650.0000 mg | Freq: Four times a day (QID) | RECTAL | Status: DC | PRN

## 2016-03-21 MED ORDER — EPHEDRINE SULFATE-NACL 50-0.9 MG/10ML-% IV SOSY
PREFILLED_SYRINGE | INTRAVENOUS | Status: DC | PRN
  Administered 2016-03-21 (×3): 10 mg via INTRAVENOUS

## 2016-03-21 MED ORDER — SODIUM CHLORIDE 0.9 % IV SOLN: INTRAVENOUS | Status: DC

## 2016-03-21 MED ORDER — DEXAMETHASONE SODIUM PHOSPHATE 10 MG/ML IJ SOLN
INTRAMUSCULAR | Status: DC | PRN
  Administered 2016-03-21: 10 mg via INTRAVENOUS

## 2016-03-21 MED ORDER — ASPIRIN 81 MG PO CHEW
81.0000 mg | CHEWABLE_TABLET | Freq: Two times a day (BID) | ORAL | Status: DC
  Administered 2016-03-22: 81 mg via ORAL
  Filled 2016-03-21: qty 1

## 2016-03-21 MED ORDER — FENTANYL CITRATE (PF) 100 MCG/2ML IJ SOLN
INTRAMUSCULAR | Status: AC
  Filled 2016-03-21: qty 2

## 2016-03-21 MED ORDER — SENNA 8.6 MG PO TABS
2.0000 | ORAL_TABLET | Freq: Every day | ORAL | Status: DC
  Administered 2016-03-21: 17.2 mg via ORAL
  Filled 2016-03-21: qty 2

## 2016-03-21 MED ORDER — ONDANSETRON HCL 4 MG/2ML IJ SOLN
INTRAMUSCULAR | Status: DC | PRN
  Administered 2016-03-21: 4 mg via INTRAVENOUS

## 2016-03-21 MED ORDER — METOCLOPRAMIDE HCL 5 MG/ML IJ SOLN: 5.0000 mg | Freq: Three times a day (TID) | INTRAMUSCULAR | Status: DC | PRN

## 2016-03-21 MED ORDER — PHENOL 1.4 % MT LIQD: 1.0000 | OROMUCOSAL | Status: DC | PRN

## 2016-03-21 SURGICAL SUPPLY — 45 items
BAG DECANTER FOR FLEXI CONT (MISCELLANEOUS) IMPLANT
BAG ZIPLOCK 12X15 (MISCELLANEOUS) IMPLANT
CAPT HIP TOTAL 2 ×2 IMPLANT
CHLORAPREP W/TINT 26ML (MISCELLANEOUS) ×2 IMPLANT
CLOTH BEACON ORANGE TIMEOUT ST (SAFETY) ×2 IMPLANT
COVER PERINEAL POST (MISCELLANEOUS) ×2 IMPLANT
DECANTER SPIKE VIAL GLASS SM (MISCELLANEOUS) ×2 IMPLANT
DERMABOND ADVANCED (GAUZE/BANDAGES/DRESSINGS) ×1
DERMABOND ADVANCED .7 DNX12 (GAUZE/BANDAGES/DRESSINGS) ×1 IMPLANT
DRAPE SHEET LG 3/4 BI-LAMINATE (DRAPES) ×6 IMPLANT
DRAPE STERI IOBAN 125X83 (DRAPES) ×2 IMPLANT
DRAPE U-SHAPE 47X51 STRL (DRAPES) ×4 IMPLANT
DRESSING AQUACEL AG SP 3.5X10 (GAUZE/BANDAGES/DRESSINGS) ×1 IMPLANT
DRSG AQUACEL AG ADV 3.5X10 (GAUZE/BANDAGES/DRESSINGS) ×2 IMPLANT
DRSG AQUACEL AG SP 3.5X10 (GAUZE/BANDAGES/DRESSINGS) ×2
ELECT PENCIL ROCKER SW 15FT (MISCELLANEOUS) ×2 IMPLANT
ELECT REM PT RETURN 15FT ADLT (MISCELLANEOUS) ×2 IMPLANT
GAUZE SPONGE 4X4 12PLY STRL (GAUZE/BANDAGES/DRESSINGS) ×2 IMPLANT
GLOVE BIO SURGEON STRL SZ8.5 (GLOVE) ×4 IMPLANT
GLOVE BIOGEL PI IND STRL 7.5 (GLOVE) ×7 IMPLANT
GLOVE BIOGEL PI IND STRL 8.5 (GLOVE) ×1 IMPLANT
GLOVE BIOGEL PI INDICATOR 7.5 (GLOVE) ×7
GLOVE BIOGEL PI INDICATOR 8.5 (GLOVE) ×1
GOWN SPEC L3 XXLG W/TWL (GOWN DISPOSABLE) ×6 IMPLANT
GOWN STRL REUS W/TWL XL LVL3 (GOWN DISPOSABLE) ×2 IMPLANT
HANDPIECE INTERPULSE COAX TIP (DISPOSABLE) ×1
HOLDER FOLEY CATH W/STRAP (MISCELLANEOUS) ×2 IMPLANT
HOOD PEEL AWAY FLYTE STAYCOOL (MISCELLANEOUS) ×4 IMPLANT
MARKER SKIN DUAL TIP RULER LAB (MISCELLANEOUS) ×2 IMPLANT
NEEDLE SPNL 18GX3.5 QUINCKE PK (NEEDLE) ×2 IMPLANT
PACK ANTERIOR HIP CUSTOM (KITS) ×2 IMPLANT
SAW OSC TIP CART 19.5X105X1.3 (SAW) ×2 IMPLANT
SEALER BIPOLAR AQUA 6.0 (INSTRUMENTS) ×2 IMPLANT
SET HNDPC FAN SPRY TIP SCT (DISPOSABLE) ×1 IMPLANT
SOL PREP POV-IOD 4OZ 10% (MISCELLANEOUS) ×2 IMPLANT
SUT ETHIBOND NAB CT1 #1 30IN (SUTURE) ×4 IMPLANT
SUT MNCRL AB 3-0 PS2 18 (SUTURE) ×2 IMPLANT
SUT MON AB 2-0 CT1 36 (SUTURE) ×4 IMPLANT
SUT STRATAFIX PDO 1 14 VIOLET (SUTURE) ×1
SUT STRATFX PDO 1 14 VIOLET (SUTURE) ×1
SUT VIC AB 2-0 CT1 27 (SUTURE) ×1
SUT VIC AB 2-0 CT1 TAPERPNT 27 (SUTURE) ×1 IMPLANT
SUTURE STRATFX PDO 1 14 VIOLET (SUTURE) ×1 IMPLANT
SYR 50ML LL SCALE MARK (SYRINGE) ×2 IMPLANT
YANKAUER SUCT BULB TIP 10FT TU (MISCELLANEOUS) ×2 IMPLANT

## 2016-03-21 NOTE — Discharge Instructions (Signed)
°Dr. Zackeriah Kissler °Joint Replacement Specialist °[facility] °[address] °[place], [place] [address] °[phone] ° ° °TOTAL HIP REPLACEMENT POSTOPERATIVE DIRECTIONS ° ° ° °Hip Rehabilitation, Guidelines Following Surgery  ° °WEIGHT BEARING °Weight bearing as tolerated with assist device (walker, cane, etc) as directed, use it as long as suggested by your surgeon or therapist, typically at least 4-6 weeks. ° °The results of a hip operation are greatly improved after range of motion and muscle strengthening exercises. Follow all safety measures which are given to protect your hip. If any of these exercises cause increased pain or swelling in your joint, decrease the amount until you are comfortable again. Then slowly increase the exercises. Call your caregiver if you have problems or questions.  ° °HOME CARE INSTRUCTIONS  °Most of the following instructions are designed to prevent the dislocation of your new hip.  °Remove items at home which could result in a fall. This includes throw rugs or furniture in walking pathways.  °Continue medications as instructed at time of discharge. °· You may have some home medications which will be placed on hold until you complete the course of blood thinner medication. °· You may start showering once you are discharged home. Do not remove your dressing. °Do not put on socks or shoes without following the instructions of your caregivers.   °Sit on chairs with arms. Use the chair arms to help push yourself up when arising.  °Arrange for the use of a toilet seat elevator so you are not sitting low.  °· Walk with walker as instructed.  °You may resume a sexual relationship in one month or when given the OK by your caregiver.  °Use walker as long as suggested by your caregivers.  °You may put full weight on your legs and walk as much as is comfortable. °Avoid periods of inactivity such as sitting longer than an hour when not asleep. This helps prevent  blood clots.  °You may return to work once you are cleared by your surgeon.  °Do not drive a car for 6 weeks or until released by your surgeon.  °Do not drive while taking narcotics.  °Wear elastic stockings for two weeks following surgery during the day but you may remove then at night.  °Make sure you keep all of your appointments after your operation with all of your doctors and caregivers. You should call the office at the above phone number and make an appointment for approximately two weeks after the date of your surgery. °Please pick up a stool softener and laxative for home use as long as you are requiring pain medications. °· ICE to the affected hip every three hours for 30 minutes at a time and then as needed for pain and swelling. Continue to use ice on the hip for pain and swelling from surgery. You may notice swelling that will progress down to the foot and ankle.  This is normal after surgery.  Elevate the leg when you are not up walking on it.   °It is important for you to complete the blood thinner medication as prescribed by your doctor. °· Continue to use the breathing machine which will help keep your temperature down.  It is common for your temperature to cycle up and down following surgery, especially at night when you are not up moving around and exerting yourself.  The breathing machine keeps your lungs expanded and your temperature down. ° °RANGE OF MOTION AND STRENGTHENING EXERCISES  °These exercises are   designed to help you keep full movement of your hip joint. Follow your caregiver's or physical therapist's instructions. Perform all exercises about fifteen times, three times per day or as directed. Exercise both hips, even if you have had only one joint replacement. These exercises can be done on a training (exercise) mat, on the floor, on a table or on a bed. Use whatever works the best and is most comfortable for you. Use music or television while you are exercising so that the exercises  are a pleasant break in your day. This will make your life better with the exercises acting as a break in routine you can look forward to.  °Lying on your back, slowly slide your foot toward your buttocks, raising your knee up off the floor. Then slowly slide your foot back down until your leg is straight again.  °Lying on your back spread your legs as far apart as you can without causing discomfort.  °Lying on your side, raise your upper leg and foot straight up from the floor as far as is comfortable. Slowly lower the leg and repeat.  °Lying on your back, tighten up the muscle in the front of your thigh (quadriceps muscles). You can do this by keeping your leg straight and trying to raise your heel off the floor. This helps strengthen the largest muscle supporting your knee.  °Lying on your back, tighten up the muscles of your buttocks both with the legs straight and with the knee bent at a comfortable angle while keeping your heel on the floor.  ° °SKILLED REHAB INSTRUCTIONS: °If the patient is transferred to a skilled rehab facility following release from the hospital, a list of the current medications will be sent to the facility for the patient to continue.  When discharged from the skilled rehab facility, please have the facility set up the patient's Home Health Physical Therapy prior to being released. Also, the skilled facility will be responsible for providing the patient with their medications at time of release from the facility to include their pain medication and their blood thinner medication. If the patient is still at the rehab facility at time of the two week follow up appointment, the skilled rehab facility will also need to assist the patient in arranging follow up appointment in our office and any transportation needs. ° °MAKE SURE YOU:  °Understand these instructions.  °Will watch your condition.  °Will get help right away if you are not doing well or get worse. ° °Pick up stool softner and  laxative for home use following surgery while on pain medications. °Do not remove your dressing. °The dressing is waterproof--it is OK to take showers. °Continue to use ice for pain and swelling after surgery. °Do not use any lotions or creams on the incision until instructed by your surgeon. °Total Hip Protocol. ° ° °

## 2016-03-21 NOTE — Op Note (Signed)
OPERATIVE REPORT  SURGEON: Samson FredericBrian Braiden Presutti, MD   ASSISTANT: Staff.  PREOPERATIVE DIAGNOSIS: Right hip arthritis.   POSTOPERATIVE DIAGNOSIS: Right hip arthritis.   PROCEDURE: Right total hip arthroplasty, anterior approach.   IMPLANTS: DePuy Tri Lock stem, size 2, std offset. DePuy Pinnacle Cup, size 48 mm. DePuy Altrx liner, size 32 by 48 mm, neutral. DePuy Biolox ceramic head ball, size 32 + 1 mm.  ANESTHESIA:  Spinal  ESTIMATED BLOOD LOSS: 400 mL.  ANTIBIOTICS: 2 g Ancef.  DRAINS: None.  COMPLICATIONS: None.   CONDITION: PACU - hemodynamically stable.Marland Kitchen.   BRIEF CLINICAL NOTE: Paula A Leatha GildingLivingston is a 65 y.o. female with a long-standing history of Right hip arthritis. After failing conservative management, the patient was indicated for total hip arthroplasty. The risks, benefits, and alternatives to the procedure were explained, and the patient elected to proceed.  PROCEDURE IN DETAIL: Surgical site was marked by myself. Spinal anesthesia was obtained in the pre-op holding area. Once inside the operative room, a foley catheter was inserted. The patient was then positioned on the Hana table. All bony prominences were well padded. The hip was prepped and draped in the normal sterile surgical fashion. A time-out was called verifying side and site of surgery. The patient received IV antibiotics within 60 minutes of beginning the procedure.  The direct anterior approach to the hip was performed through the Hueter interval. Lateral femoral circumflex vessels were treated with the Auqumantys. The anterior capsule was exposed and an inverted T capsulotomy was made.The femoral neck cut was made to the level of the templated cut. A corkscrew was placed into the head and the head was removed. The femoral head was found to have eburnated bone. The head was passed to the back table and was measured.  Acetabular exposure was achieved, and the pulvinar and labrum were excised.  Sequental reaming of the acetabulum was then performed up to a size 51 mm reamer. A 52 mm cup was then opened and impacted into place at approximately 40 degrees of abduction and 20 degrees of anteversion. The final polyethylene liner was impacted into place and acetabular osteophytes were removed.   I then gained femoral exposure taking care to protect the abductors and greater trochanter. This was performed using standard external rotation, extension, and adduction. The capsule was peeled off the inner aspect of the greater trochanter, taking care to preserve the short external rotators. A cookie cutter was used to enter the femoral canal, and then the femoral canal finder was placed. Sequential broaching was performed up to a size 2. Calcar planer was used on the femoral neck remnant. I placed a std offset neck and a trial head ball. The hip was reduced. Leg lengths and offset were checked fluoroscopically. The hip was dislocated and trial components were removed. The final implants were placed, and the hip was reduced.  Fluoroscopy was used to confirm component position and leg lengths. At 90 degrees of external rotation and full extension, the hip was stable to an anterior directed force.  The wound was copiously irrigated with a dilute betadine solution followed by normal saline. Marcaine solution was injected into the periarticular soft tissue. The wound was closed in layers using #1 Vicryl and V-Loc for the fascia, 2-0 Vicryl for the subcutaneous fat, 2-0 Monocryl for the deep dermal layer, 3-0 running Monocryl subcuticular stitch, and Dermabond for the skin. Once the glue was fully dried, an Aquacell Ag dressing was applied. The patient was transported to the recovery room in  stable condition. Sponge, needle, and instrument counts were correct at the end of the case x2. The patient tolerated the procedure well and there were no known complications.

## 2016-03-21 NOTE — Anesthesia Postprocedure Evaluation (Signed)
Anesthesia Post Note  Patient: Paula Hardy  Procedure(s) Performed: Procedure(s) (LRB): RIGHT TOTAL HIP ARTHROPLASTY ANTERIOR APPROACH (Right)  Patient location during evaluation: PACU Anesthesia Type: Spinal and MAC Level of consciousness: awake and alert Pain management: pain level controlled Vital Signs Assessment: post-procedure vital signs reviewed and stable Respiratory status: spontaneous breathing and respiratory function stable Cardiovascular status: blood pressure returned to baseline and stable Postop Assessment: spinal receding Anesthetic complications: no    Last Vitals:  Vitals:   03/21/16 1700 03/21/16 1715  BP: (!) 146/72 (!) 149/67  Pulse: 60 (!) 54  Resp: 14 17  Temp: 36.4 C     Last Pain:  Vitals:   03/21/16 1715  TempSrc:   PainSc: 3     LLE Motor Response: Purposeful movement (03/21/16 1715) LLE Sensation: Decreased (03/21/16 1715) RLE Motor Response: Purposeful movement (03/21/16 1715) RLE Sensation: Decreased (03/21/16 1715) L Sensory Level: S1-Sole of foot, small toes (03/21/16 1715) R Sensory Level: S1-Sole of foot, small toes (03/21/16 1715)  [name] DANIEL

## 2016-03-21 NOTE — Anesthesia Preprocedure Evaluation (Addendum)
Anesthesia Evaluation  Patient identified by MRN, date of birth, ID band Patient awake    Reviewed: Allergy & Precautions, NPO status , Patient's Chart, lab work & pertinent test results  History of Anesthesia Complications Negative for: history of anesthetic complications  Airway Mallampati: II  TM Distance: >3 FB Neck ROM: Full    Dental no notable dental hx. (+) Dental Advisory Given   Pulmonary former smoker,    Pulmonary exam normal        Cardiovascular negative cardio ROS Normal cardiovascular exam     Neuro/Psych negative neurological ROS  negative psych ROS   GI/Hepatic negative GI ROS, Neg liver ROS,   Endo/Other  negative endocrine ROS  Renal/GU negative Renal ROS     Musculoskeletal   Abdominal   Peds  Hematology   Anesthesia Other Findings   Reproductive/Obstetrics                            Anesthesia Physical Anesthesia Plan  ASA: II  Anesthesia Plan: MAC and Spinal   Post-op Pain Management:    Induction:   Airway Management Planned: Natural Airway and Simple Face Mask  Additional Equipment:   Intra-op Plan:   Post-operative Plan:   Informed Consent: I have reviewed the patients History and Physical, chart, labs and discussed the procedure including the risks, benefits and alternatives for the proposed anesthesia with the patient or authorized representative who has indicated his/her understanding and acceptance.   Dental advisory given  Plan Discussed with: CRNA and Anesthesiologist  Anesthesia Plan Comments:        Anesthesia Quick Evaluation

## 2016-03-21 NOTE — H&P (View-Only) (Signed)
TOTAL HIP ADMISSION H&P  Patient is admitted for right total hip arthroplasty.  Subjective:  Chief Complaint: right hip pain  HPI: Paula Hardy, 65 y.o. female, has a history of pain and functional disability in the right hip(s) due to arthritis and patient has failed non-surgical conservative treatments for greater than 12 weeks to include NSAID's and/or analgesics, flexibility and strengthening excercises, use of assistive devices, weight reduction as appropriate and activity modification.  Onset of symptoms was gradual starting 1 years ago with rapidlly worsening course since that time.The patient noted no past surgery on the right hip(s).  Patient currently rates pain in the right hip at 10 out of 10 with activity. Patient has night pain, worsening of pain with activity and weight bearing, pain that interfers with activities of daily living and pain with passive range of motion. Patient has evidence of subchondral cysts, subchondral sclerosis, periarticular osteophytes and joint space narrowing by imaging studies. This condition presents safety issues increasing the risk of falls. There is no current active infection.  There are no active problems to display for this patient.  Past Medical History:  Diagnosis Date  . Fibromyalgia     No past surgical history on file.   (Not in a hospital admission) No Known Allergies  Social History  Substance Use Topics  . Smoking status: Not on file  . Smokeless tobacco: Not on file  . Alcohol use Not on file    No family history on file.   Review of Systems  Constitutional: Negative.   HENT: Negative.   Eyes: Negative.   Respiratory: Negative.   Cardiovascular: Negative.   Gastrointestinal: Negative.   Genitourinary: Negative.   Musculoskeletal: Positive for back pain and joint pain.  Skin: Negative.   Neurological: Negative.   Endo/Heme/Allergies: Negative.   Psychiatric/Behavioral: Negative.     Objective:  Physical Exam   Vitals reviewed. Constitutional: She is oriented to person, place, and time. She appears well-developed and well-nourished.  HENT:  Head: Normocephalic and atraumatic.  Eyes: Conjunctivae and EOM are normal. Pupils are equal, round, and reactive to light.  Neck: Normal range of motion. Neck supple.  Cardiovascular: Normal rate and regular rhythm.   Respiratory: Effort normal. No respiratory distress.  GI: Soft. She exhibits no distension.  Genitourinary:  Genitourinary Comments: deferred  Musculoskeletal:       Right hip: She exhibits decreased range of motion, decreased strength and bony tenderness.  Neurological: She is alert and oriented to person, place, and time. She has normal reflexes.  Skin: Skin is warm and dry.  Psychiatric: She has a normal mood and affect. Her behavior is normal. Judgment and thought content normal.    Vital signs in last 24 hours: @VSRANGES @  Labs:   Estimated body mass index is 34.42 kg/m as calculated from the following:   Height as of 11/24/12: 4\' 11"  (1.499 m).   Weight as of 11/24/12: 77.3 kg (170 lb 6.4 oz).   Imaging Review Plain radiographs demonstrate severe degenerative joint disease of the right hip(s). The bone quality appears to be adequate for age and reported activity level.  Assessment/Plan:  End stage arthritis, right hip(s)  The patient history, physical examination, clinical judgement of the provider and imaging studies are consistent with end stage degenerative joint disease of the right hip(s) and total hip arthroplasty is deemed medically necessary. The treatment options including medical management, injection therapy, arthroscopy and arthroplasty were discussed at length. The risks and benefits of total hip arthroplasty were presented  and reviewed. The risks due to aseptic loosening, infection, stiffness, dislocation/subluxation,  thromboembolic complications and other imponderables were discussed.  The patient acknowledged the  explanation, agreed to proceed with the plan and consent was signed. Patient is being admitted for inpatient treatment for surgery, pain control, PT, OT, prophylactic antibiotics, VTE prophylaxis, progressive ambulation and ADL's and discharge planning.The patient is planning to be discharged home with home health services

## 2016-03-21 NOTE — Transfer of Care (Signed)
Immediate Anesthesia Transfer of Care Note  Patient: Paula Hardy  Procedure(s) Performed: Procedure(s): RIGHT TOTAL HIP ARTHROPLASTY ANTERIOR APPROACH (Right)  Patient Location: PACU  Anesthesia Type:Spinal  Level of Consciousness:  sedated, patient cooperative and responds to stimulation  Airway & Oxygen Therapy:Patient Spontanous Breathing and Patient connected to face mask oxgen  Post-op Assessment:  Report given to PACU RN and Post -op Vital signs reviewed and stable  Post vital signs:  Reviewed and stable  Last Vitals:  Vitals:   03/21/16 1046  BP: (!) 163/72  Pulse: 88  Resp: 16  Temp: 36.9 C    Complications: No apparent anesthesia complications

## 2016-03-21 NOTE — Interval H&P Note (Signed)
History and Physical Interval Note:  03/21/2016 12:55 PM  Paula Hardy  has presented today for surgery, with the diagnosis of DEGENERATIVE JOINT RIGHT HIP  The various methods of treatment have been discussed with the patient and family. After consideration of risks, benefits and other options for treatment, the patient has consented to  Procedure(s): RIGHT TOTAL HIP ARTHROPLASTY ANTERIOR APPROACH (Right) as a surgical intervention .  The patient's history has been reviewed, patient examined, no change in status, stable for surgery.  I have reviewed the patient's chart and labs.  Questions were answered to the patient's satisfaction.     Joy Reiger, Cloyde ReamsBrian James

## 2016-03-21 NOTE — Anesthesia Procedure Notes (Addendum)
Spinal  Patient location during procedure: OR Start time: 03/21/2016 1:38 PM End time: 03/21/2016 1:50 PM Staffing Anesthesiologist: Heather RobertsSINGER, JAMES Resident/CRNA: Lyda KalataJARVELA, Silvano Garofano R Performed: resident/CRNA  Preanesthetic Checklist Completed: patient identified, site marked, surgical consent, pre-op evaluation, timeout performed, IV checked, risks and benefits discussed and monitors and equipment checked Spinal Block Patient position: sitting Prep: DuraPrep Patient monitoring: heart rate, continuous pulse ox and blood pressure Approach: right paramedian Location: L3-4 Needle Needle type: Spinocan  Needle gauge: 22 G Needle length: 9 cm Needle insertion depth: 7 cm Assessment Sensory level: T6 Additional Notes Attempt x1 midline with 24 g sprotte, unable to obtain CSF. Attempt x2 22g spinocan R paramedian, positive CSF, negative heme, negative parasthesia.

## 2016-03-22 ENCOUNTER — Encounter (HOSPITAL_COMMUNITY): Payer: Self-pay | Admitting: Orthopedic Surgery

## 2016-03-22 LAB — CBC
HEMATOCRIT: 32.6 % — AB (ref 36.0–46.0)
HEMOGLOBIN: 10.6 g/dL — AB (ref 12.0–15.0)
MCH: 28.2 pg (ref 26.0–34.0)
MCHC: 32.5 g/dL (ref 30.0–36.0)
MCV: 86.7 fL (ref 78.0–100.0)
Platelets: 259 10*3/uL (ref 150–400)
RBC: 3.76 MIL/uL — ABNORMAL LOW (ref 3.87–5.11)
RDW: 13.7 % (ref 11.5–15.5)
WBC: 9.2 10*3/uL (ref 4.0–10.5)

## 2016-03-22 LAB — BASIC METABOLIC PANEL
ANION GAP: 7 (ref 5–15)
BUN: 11 mg/dL (ref 6–20)
CHLORIDE: 106 mmol/L (ref 101–111)
CO2: 23 mmol/L (ref 22–32)
Calcium: 8 mg/dL — ABNORMAL LOW (ref 8.9–10.3)
Creatinine, Ser: 0.73 mg/dL (ref 0.44–1.00)
GFR calc non Af Amer: >60 mL/min (ref >60)
GLUCOSE: 136 mg/dL — AB (ref 65–99)
POTASSIUM: 4.2 mmol/L (ref 3.5–5.1)
Sodium: 136 mmol/L (ref 135–145)

## 2016-03-22 MED ORDER — HYDROCODONE-ACETAMINOPHEN 5-325 MG PO TABS: 1.0000 | ORAL_TABLET | ORAL | 0 refills | Status: DC | PRN

## 2016-03-22 MED ORDER — ONDANSETRON HCL 4 MG PO TABS: 4.0000 mg | ORAL_TABLET | Freq: Four times a day (QID) | ORAL | 0 refills | Status: DC | PRN

## 2016-03-22 MED ORDER — DOCUSATE SODIUM 100 MG PO CAPS: 100.0000 mg | ORAL_CAPSULE | Freq: Two times a day (BID) | ORAL | 0 refills | Status: DC

## 2016-03-22 MED ORDER — SENNA 8.6 MG PO TABS: 2.0000 | ORAL_TABLET | Freq: Every day | ORAL | 0 refills | Status: DC

## 2016-03-22 MED ORDER — ASPIRIN 81 MG PO CHEW: 81.0000 mg | CHEWABLE_TABLET | Freq: Two times a day (BID) | ORAL | 1 refills | Status: DC

## 2016-03-22 NOTE — Evaluation (Signed)
Occupational Therapy Evaluation Patient Details Name: Paula Hardy MRN: [id number] DOB: January 23, 1951 Today's Date: 03/22/2016    History of Present Illness 65 yo female s/p R THA-direct anterior 03/21/16   Clinical Impression   OT education complete - pt has ordered AE and has needed DME.  Sister will A pt as needed    Follow Up Recommendations  No OT follow up    Equipment Recommendations  None recommended by OT    Recommendations for Other Services       Precautions / Restrictions Precautions Precautions: Fall Restrictions Weight Bearing Restrictions: No RLE Weight Bearing: Weight bearing as tolerated      Mobility Bed Mobility               General bed mobility comments: pt in chair  Transfers Overall transfer level: Needs assistance Equipment used: Rolling walker (2 wheeled) Transfers: Sit to/from UGI CorporationStand;Stand Pivot Transfers Sit to Stand: Min assist Stand pivot transfers: Min assist       General transfer comment: VC for safety         ADL Overall ADL's : Needs assistance/impaired     Grooming: Set up;Sitting   Upper Body Bathing: Set up;Sitting   Lower Body Bathing: Sit to/from stand;Cueing for safety;Moderate assistance;With adaptive equipment;Cueing for sequencing   Upper Body Dressing : Set up;Sitting   Lower Body Dressing: Moderate assistance;Cueing for sequencing;Cueing for safety;With adaptive equipment;Sit to/from stand   Toilet Transfer: Minimal assistance;RW;Ambulation;Cueing for sequencing;Cueing for safety   Toileting- Clothing Manipulation and Hygiene: Minimal assistance;Cueing for safety;Cueing for sequencing;Sit to/from stand                         Pertinent Vitals/Pain Pain Assessment: 0-10 Pain Score: 7  Pain Location: R hip/thigh Pain Descriptors / Indicators: Sore Pain Intervention(s): Limited activity within patient's tolerance;Repositioned;Ice applied     Hand Dominance     Extremity/Trunk  Assessment Upper Extremity Assessment Upper Extremity Assessment: Defer to OT evaluation   Lower Extremity Assessment Lower Extremity Assessment: Generalized weakness   Cervical / Trunk Assessment Cervical / Trunk Assessment: Normal   Communication Communication Communication: No difficulties   Cognition Arousal/Alertness: Awake/alert Behavior During Therapy: WFL for tasks assessed/performed Overall Cognitive Status: Within Functional Limits for tasks assessed                                Home Living Family/patient expects to be discharged to:: Private residence Living Arrangements: Children Available Help at Discharge: Family Type of Home:  (pt planning to d/c to her daughter's home) Home Access: Level entry           Bathroom Shower/Tub: Producer, television/film/videoWalk-in shower   Bathroom Toilet: Standard                Prior Functioning/Environment Level of Independence: Independent                       OT Goals(Current goals can be found in the care plan section) Acute Rehab OT Goals Patient Stated Goal: home soon OT Goal Formulation: With patient Time For Goal Achievement: 03/22/16 Potential to Achieve Goals: Good  OT Frequency:                End of Session Equipment Utilized During Treatment: Rolling walker Nurse Communication: Mobility status  Activity Tolerance: Patient tolerated treatment well Patient left: in chair;with call bell/phone within reach  Time: 1050-1106 OT Time Calculation (min): 16 min Charges:  OT General Charges $OT Visit: 1 Procedure OT Evaluation $OT Eval Moderate Complexity: 1 Procedure G-Codes:    Alba CoryEDDING, Paije Goodhart D 03/22/2016, 10:24 AM

## 2016-03-22 NOTE — Discharge Summary (Signed)
Physician Discharge Summary  Patient ID: Paula Hardy MRN: [id number] DOB/AGE: 63-Aug-1952 63 y.o.  Admit date: 03/21/2016 Discharge date: 03/22/2016  Admission Diagnoses:  Primary osteoarthritis of right hip  Discharge Diagnoses:  Principal Problem:   Primary osteoarthritis of right hip   Past Medical History:  Diagnosis Date  . Arthritis     Surgeries: Procedure(s): RIGHT TOTAL HIP ARTHROPLASTY ANTERIOR APPROACH on 03/21/2016   Consultants (if any):   Discharged Condition: Improved  Hospital Course: Paula Hardy is an 65 y.o. female who was admitted 03/21/2016 with a diagnosis of Primary osteoarthritis of right hip and went to the operating room on 03/21/2016 and underwent the above named procedures.    She was given perioperative antibiotics:  Anti-infectives    Start     Dose/Rate Route Frequency Ordered Stop   03/21/16 2000  ceFAZolin (ANCEF) IVPB 1 g/50 mL premix     1 g 100 mL/hr over 30 Minutes Intravenous Every 6 hours 03/21/16 1746 03/22/16 0307   03/21/16 1052  ceFAZolin (ANCEF) IVPB 2g/100 mL premix     2 g 200 mL/hr over 30 Minutes Intravenous On call to O.R. 03/21/16 1052 03/21/16 1355    .  She was given sequential compression devices, early ambulation, and ASA for DVT prophylaxis.  She benefited maximally from the hospital stay and there were no complications.    Recent vital signs:  Vitals:   03/22/16 0607 03/22/16 1033  BP: 136/62 (!) 136/56  Pulse: 76 68  Resp: 16 16  Temp: 97.8 F (36.6 C) 98.2 F (36.8 C)    Recent laboratory studies:  Lab Results  Component Value Date   HGB 10.6 (L) 03/22/2016   HGB 13.5 03/13/2016   Lab Results  Component Value Date   WBC 9.2 03/22/2016   PLT 259 03/22/2016   No results found for: INR Lab Results  Component Value Date   NA 136 03/22/2016   K 4.2 03/22/2016   CL 106 03/22/2016   CO2 23 03/22/2016   BUN 11 03/22/2016   CREATININE 0.73 03/22/2016   GLUCOSE 136 (H) 03/22/2016     Discharge Medications:     Medication List    TAKE these medications   aspirin 81 MG chewable tablet Chew 1 tablet (81 mg total) by mouth 2 (two) times daily.   cholecalciferol 1000 units tablet Commonly known as:  VITAMIN D Take 1,000 Units by mouth daily.   cyclobenzaprine 10 MG tablet Commonly known as:  FLEXERIL Take 10 mg by mouth 3 (three) times daily as needed for muscle spasms.   diclofenac 75 MG EC tablet Commonly known as:  VOLTAREN Take 150 mg by mouth every morning.   docusate sodium 100 MG capsule Commonly known as:  COLACE Take 1 capsule (100 mg total) by mouth 2 (two) times daily.   HYDROcodone-acetaminophen 5-325 MG tablet Commonly known as:  NORCO/VICODIN Take 1-2 tablets by mouth every 4 (four) hours as needed (breakthrough pain).   ondansetron 4 MG tablet Commonly known as:  ZOFRAN Take 1 tablet (4 mg total) by mouth every 6 (six) hours as needed for nausea.   senna 8.6 MG Tabs tablet Commonly known as:  SENOKOT Take 2 tablets (17.2 mg total) by mouth at bedtime.            Durable Medical Equipment        Start     Ordered   03/21/16 1747  DME Walker rolling  Once    Question:  Patient  needs a walker to treat with the following condition  Answer:  Primary osteoarthritis of right hip   03/21/16 1746   03/21/16 1747  DME 3 n 1  Once     03/21/16 1746   03/21/16 1747  DME Bedside commode  Once    Question:  Patient needs a bedside commode to treat with the following condition  Answer:  Primary osteoarthritis of right hip   03/21/16 1746      Diagnostic Studies: Dg Pelvis Portable  Result Date: 03/21/2016 CLINICAL DATA:  Postop right anterior approach arthroplasty. EXAM: PORTABLE PELVIS 1-2 VIEWS COMPARISON:  None. FINDINGS: The AP views centered over both hips demonstrate a DePuy trilock uncemented femoral stem with press fit acetabular cup. The ceramic femoral head is seated within the acetabular component. Postsurgical changes are  noted of the surrounding soft tissues. No postoperative fracture or malalignment. IMPRESSION: Postsurgical changes are seen about a DePuy uncemented total hip arthroplasty without evidence of postoperative fracture nor hardware failure. These results were called by telephone at the time of interpretation on 03/21/2016 at 4:47 pm to [facility], who verbally acknowledged these results. Electronically Signed   By: Tollie Ethavid  Kwon M.D.   On: 03/21/2016 16:48   Dg C-arm 61-120 Min-no Report  Result Date: 03/21/2016 CLINICAL DATA:  Intraoperative fluoro spot radiographs from right total hip joint replacement. 30 seconds fluoro time reported EXAM: OPERATIVE right HIP (WITH PELVIS IF PERFORMED) 2 VIEWS TECHNIQUE: Fluoroscopic spot image(s) were submitted for interpretation post-operatively. COMPARISON:  Lumbar spine and pelvis radiographs of August 10, 2015 FINDINGS: The patient has undergone right total hip arthroplasty. Radiographic positioning of the prosthetic components is good. The interface with the native bone is normal. No acute native bone abnormality is demonstrated. IMPRESSION: There is no immediate postprocedure complication following right total hip joint replacement. Electronically Signed   By: David  SwazilandJordan M.D.   On: 03/21/2016 16:43   Dg Hip Operative Unilat With Pelvis Right  Result Date: 03/21/2016 CLINICAL DATA:  Intraoperative fluoro spot radiographs from right total hip joint replacement. 30 seconds fluoro time reported EXAM: OPERATIVE right HIP (WITH PELVIS IF PERFORMED) 2 VIEWS TECHNIQUE: Fluoroscopic spot image(s) were submitted for interpretation post-operatively. COMPARISON:  Lumbar spine and pelvis radiographs of August 10, 2015 FINDINGS: The patient has undergone right total hip arthroplasty. Radiographic positioning of the prosthetic components is good. The interface with the native bone is normal. No acute native bone abnormality is demonstrated. IMPRESSION: There is no immediate  postprocedure complication following right total hip joint replacement. Electronically Signed   By: David  [place] M.D.   On: 03/21/2016 16:43    Disposition: Final discharge disposition not confirmed  Discharge Instructions    Call MD / Call 911    Complete by:  As directed    If you experience chest pain or shortness of breath, CALL 911 and be transported to the hospital emergency room.  If you develope a fever above 101 F, pus (white drainage) or increased drainage or redness at the wound, or calf pain, call your surgeon's office.   Constipation Prevention    Complete by:  As directed    Drink plenty of fluids.  Prune juice may be helpful.  You may use a stool softener, such as Colace (over the counter) 100 mg twice a day.  Use MiraLax (over the counter) for constipation as needed.   Diet - low sodium heart healthy    Complete by:  As directed    Driving  restrictions    Complete by:  As directed    No driving for 6 weeks   Increase activity slowly as tolerated    Complete by:  As directed    Lifting restrictions    Complete by:  As directed    No lifting for 6 weeks   TED hose    Complete by:  As directed    Use stockings (TED hose) for 2 weeks on both leg(s).  You may remove them at night for sleeping.      Follow-up Information    Janie Strothman, Cloyde ReamsBrian James, MD. Schedule an appointment as soon as possible for a visit in 2 week(s).   Specialty:  Orthopedic Surgery Why:  For wound re-check Contact information: [address] [place] [address] [phone]            Signed: Garnet KoyanagiSwinteck, Amelita Risinger James 03/22/2016, 10:51 AM

## 2016-03-22 NOTE — Evaluation (Signed)
Physical Therapy Evaluation Patient Details Name: Paula Hardy MRN: [id number] DOB: [birth year] Today's Date: 03/22/2016   History of Present Illness  65 yo female s/p R THA-direct anterior 03/21/16  Clinical Impression  On eval, pt required Min assist for mobility. She walked ~60 feet with a RW. Pain rated 7/10 with activity. Will follow and progress activity as tolerated.    Follow Up Recommendations Home health PT    Equipment Recommendations  Rolling walker with 5" wheels (youth height)    Recommendations for Other Services       Precautions / Restrictions Precautions Precautions: Fall Restrictions Weight Bearing Restrictions: No RLE Weight Bearing: Weight bearing as tolerated      Mobility  Bed Mobility Overal bed mobility: Needs Assistance Bed Mobility: Supine to Sit     Supine to sit: Min assist;HOB elevated     General bed mobility comments: Assist for R LE. Increased time. VCs technique.   Transfers Overall transfer level: Needs assistance Equipment used: Rolling walker (2 wheeled) Transfers: Sit to/from Stand Sit to Stand: Min assist        General transfer comment: Assist to rise, stabilize, control descent. VCs safety, hand placement  Ambulation/Gait Ambulation/Gait assistance: Min guard Ambulation Distance (Feet): 60 Feet Assistive device: Rolling walker (2 wheeled) Gait Pattern/deviations: Step-to pattern;Antalgic     General Gait Details: close guard for safety. slow gait speed.   Stairs            Wheelchair Mobility    Modified Rankin (Stroke Patients Only)       Balance                                             Pertinent Vitals/Pain Pain Assessment: 0-10 Pain Score: 7  Pain Location: R hip/thigh Pain Descriptors / Indicators: Sore Pain Intervention(s): Limited activity within patient's tolerance;Repositioned;Ice applied    Home Living Family/patient expects to be discharged to:: Private  residence Living Arrangements: Children Available Help at Discharge: Family Type of Home:  (pt planning to d/c to her daughter's home) Home Access: Level entry              Prior Function Level of Independence: Independent               Hand Dominance        Extremity/Trunk Assessment   Upper Extremity Assessment: Defer to OT evaluation           Lower Extremity Assessment: Generalized weakness      Cervical / Trunk Assessment: Normal  Communication   Communication: No difficulties  Cognition Arousal/Alertness: Awake/alert Behavior During Therapy: WFL for tasks assessed/performed Overall Cognitive Status: Within Functional Limits for tasks assessed                      General Comments      Exercises Total Joint Exercises Ankle Circles/Pumps: AROM;Both;10 reps;Supine Quad Sets: AROM;Both;10 reps;Supine Heel Slides: AAROM;Right;10 reps;Supine Hip ABduction/ADduction: AAROM;Right;10 reps;Supine   Assessment/Plan    PT Assessment Patient needs continued PT services  PT Problem List Decreased strength;Decreased mobility;Decreased range of motion;Decreased activity tolerance;Decreased balance;Decreased knowledge of use of DME;Pain          PT Treatment Interventions DME instruction;Therapeutic activities;Gait training;Therapeutic exercise;Patient/family education;Stair training;Functional mobility training    PT Goals (Current goals can be found in the Care Plan section)  Acute Rehab  PT Goals Patient Stated Goal: home soon PT Goal Formulation: With patient Time For Goal Achievement: 04/05/16 Potential to Achieve Goals: Good    Frequency 7X/week   Barriers to discharge        Co-evaluation               End of Session Equipment Utilized During Treatment: Gait belt Activity Tolerance: Patient tolerated treatment well Patient left: in chair;with call bell/phone within reach;with chair alarm set;with family/visitor present            Time: [id number] PT Time Calculation (min) (ACUTE ONLY): 23 min   Charges:   PT Evaluation $PT Eval Low Complexity: 1 Procedure PT Treatments $Gait Training: 8-22 mins   PT G Codes:        Paula Hardy, MPT Pager: [phone]

## 2016-03-22 NOTE — Progress Notes (Signed)
Discharge planning, spoke with patient and grandson at beside. [facility]. Needs RW and 3-n-1, contacted [facility] to deliver to room. Will d/c to sisters [address], [place], [id number]. [phone]

## 2016-03-22 NOTE — Progress Notes (Signed)
Physical Therapy Treatment Patient Details Name: Catha BrowCarlis A Peary MRN: [id number] DOB: 07-08-1950 Today's Date: 03/22/2016    History of Present Illness 65 yo female s/p R THA-direct anterior 03/21/16    PT Comments    Progressing with mobility. Ready to d/c from PT standpoint.   Follow Up Recommendations  Home health PT     Equipment Recommendations  Rolling walker with 5" wheels (youth height)    Recommendations for Other Services       Precautions / Restrictions Precautions Precautions: Fall Restrictions Weight Bearing Restrictions: No RLE Weight Bearing: Weight bearing as tolerated    Mobility  Bed Mobility Overal bed mobility: Needs Assistance Bed Mobility: Sit to Supine      Sit to supine: Min assist;HOB elevated   General bed mobility comments: Assist for R LE onto bed.   Transfers Overall transfer level: Needs assistance Equipment used: Rolling walker (2 wheeled) Transfers: Sit to/from Stand Sit to Stand: Min guard         General transfer comment: close guard for safety. VCs hand placement  Ambulation/Gait Ambulation/Gait assistance: Min guard Ambulation Distance (Feet): 70 Feet Assistive device: Rolling walker (2 wheeled) Gait Pattern/deviations: Step-to pattern;Step-through pattern     General Gait Details: close guard for safety. slow gait speed.    Stairs            Wheelchair Mobility    Modified Rankin (Stroke Patients Only)       Balance                                    Cognition Arousal/Alertness: Awake/alert Behavior During Therapy: WFL for tasks assessed/performed Overall Cognitive Status: Within Functional Limits for tasks assessed                      Exercises Total Joint Exercises Ankle Circles/Pumps: AROM;Both;10 reps;Supine Quad Sets: AROM;Both;10 reps;Supine Heel Slides: AAROM;Right;10 reps;Supine Hip ABduction/ADduction: AAROM;Right;10 reps;Supine    General Comments        Pertinent Vitals/Pain Pain Assessment: 0-10 Pain Score: 7  Pain Location: R hip/thigh Pain Descriptors / Indicators: Sore Pain Intervention(s): Limited activity within patient's tolerance    Home Living                      Prior Function            PT Goals (current goals can now be found in the care plan section) Acute Rehab PT Goals Patient Stated Goal: home soon PT Goal Formulation: With patient Time For Goal Achievement: 04/05/16 Potential to Achieve Goals: Good Progress towards PT goals: Progressing toward goals    Frequency    7X/week      PT Plan Current plan remains appropriate    Co-evaluation             End of Session Equipment Utilized During Treatment: Gait belt Activity Tolerance: Patient tolerated treatment well Patient left: in bed;with call bell/phone within reach     Time: 1340-1350 PT Time Calculation (min) (ACUTE ONLY): 10 min  Charges:  $Gait Training: 8-22 mins                    G Codes:      Rebeca AlertJannie Setareh Rom, MPT Pager: [phone]

## 2016-03-22 NOTE — Progress Notes (Signed)
   Subjective:  Patient reports pain as mild to moderate.  Denies N/V/CP/SOB.  Objective:   VITALS:   Vitals:   03/21/16 2100 03/22/16 0107 03/22/16 0607 03/22/16 1033  BP: (!) 140/55 (!) 138/57 136/62 (!) 136/56  Pulse: 70 76 76 68  Resp: 16 16 16 16   Temp: 97.5 F (36.4 C) 98 F (36.7 C) 97.8 F (36.6 C) 98.2 F (36.8 C)  TempSrc: Oral Oral Oral Oral  SpO2: 98% 100% 100% 100%  Weight:      Height:        NAD Neurologically intact ABD soft Sensation intact distally Intact pulses distally Dorsiflexion/Plantar flexion intact Incision: dressing C/D/I Compartment soft    Lab Results  Component Value Date   WBC 9.2 03/22/2016   HGB 10.6 (L) 03/22/2016   HCT 32.6 (L) 03/22/2016   MCV 86.7 03/22/2016   PLT 259 03/22/2016   BMET    Component Value Date/Time   NA 136 03/22/2016 0402   K 4.2 03/22/2016 0402   CL 106 03/22/2016 0402   CO2 23 03/22/2016 0402   GLUCOSE 136 (H) 03/22/2016 0402   BUN 11 03/22/2016 0402   CREATININE 0.73 03/22/2016 0402   CALCIUM 8.0 (L) 03/22/2016 0402   GFRNONAA >60 03/22/2016 0402   GFRAA >60 03/22/2016 0402     Assessment/Plan: 1 Day Post-Op   Principal Problem:   Primary osteoarthritis of right hip   WBAT with walker DVT ppx: ASA, SCDs, TEDS PO pain control PT/OT Dispo: D/C home after clears therapy   Thanh Pomerleau, Cloyde ReamsBrian James 03/22/2016, 10:47 AM   Samson FredericBrian Pedrohenrique [place], MD Cell [phone]

## 2016-05-16 ENCOUNTER — Other Ambulatory Visit: Payer: Self-pay | Admitting: *Deleted

## 2016-05-16 NOTE — Patient Outreach (Addendum)
[facility]) Care Management  05/16/2016  Catha BrowCarlis A Barsamian 1951/04/26 [id number]   Subjective: Telephone call to patient's home / mobile number, no answer, left HIPAA compliant voicemail message, and requested call back.   Objective: Per chart and [employer] i[employer]aborate review, patient hospitalized  03/21/16 - 03/22/16 for Primary osteoarthritis of right hip.    Status post Right total hip arthroplasty, anterior approach on 03/21/16.      Assessment: Received [employer] Transition of care referral on 03/26/16.   Transition of care follow up pending patient contact.   Plan: RNCM will call patient for 2nd telephone outreach attempt, transition of care follow up, within 10 business days, if no return call.   Ebony Yorio H. Gardiner Barefootooper RN, BSN, CCM [facility] Care [facility] Telephonic CM Phone: [phone] Fax: [phone]

## 2016-05-17 ENCOUNTER — Other Ambulatory Visit: Payer: Self-pay | Admitting: *Deleted

## 2016-05-17 ENCOUNTER — Ambulatory Visit: Payer: Self-pay | Admitting: *Deleted

## 2016-05-17 NOTE — Patient Outreach (Addendum)
[facility]) Care Management  05/17/2016  Paula BrowCarlis A Hardy [birth year] [id number]   Subjective: Telephone call to patient's home / mobile number, no answer, left HIPAA compliant voicemail message, and requested call back.   Objective: Per chart and [employer] iCollaborate review, patient hospitalized  03/21/16 - 03/22/16 for Primary osteoarthritis of right hip.    Status post Righttotal hip arthroplasty, anterior approach on 03/21/16.      Assessment: Received [employer] Transition of care referral on 03/26/16. Transition of care follow up pending patient contact.   Plan: RNCM will call patient for 3rd telephone outreach attempt, transition of care follow up, within 10 business days, if no return call.   Mishka Stegemann H. Gardiner Barefootooper RN, BSN, CCM [facility] Care Management [facility] Telephonic CM Phone: [phone] Fax: [phone]

## 2016-05-21 ENCOUNTER — Ambulatory Visit: Payer: Self-pay | Admitting: *Deleted

## 2016-05-22 ENCOUNTER — Other Ambulatory Visit: Payer: Self-pay | Admitting: *Deleted

## 2016-05-22 ENCOUNTER — Encounter: Payer: Self-pay | Admitting: *Deleted

## 2016-05-22 ENCOUNTER — Ambulatory Visit: Payer: Self-pay | Admitting: *Deleted

## 2016-05-22 NOTE — Patient Outreach (Signed)
[facility]) Care Management  05/22/2016  Paula Hardy Oct 24, 1950 [id number]   Subjective:Telephone call to patient's home / mobile number, no answer, left HIPAA compliant voicemail message, and requested call back.   Objective: Per chart and [employer] iCollaborate review, patient hospitalized 03/21/16 - 03/22/16 for Primary osteoarthritis of right hip. Status post Righttotal hip arthroplasty, anterior approach on 03/21/16.    Assessment: Received [employer] Transition of care referral on 03/26/16. Transition of care follow up pending patient contact.   Plan: RNCM will send patient unsuccessful outreach letter, [facility] pamphlet, and proceed with case closure within 10 business days, if no return call.    Bristol Osentoski H. Gardiner Barefootooper RN, BSN, CCM [facility] Care Management [facility] Telephonic CM Phone: [phone] Fax: [phone]

## 2016-06-05 ENCOUNTER — Other Ambulatory Visit: Payer: Self-pay | Admitting: *Deleted

## 2016-06-05 NOTE — Patient Outreach (Signed)
[facility]) Care Management  06/05/2016  Paula Hardy 03-Apr-1951 [id number]   No response to patient outreach attempts, will proceed with case closure.  Objective: Per chart and [employer] iCollaborate review, patient hospitalized 03/21/16 - 03/22/16 for Primary osteoarthritis of right hip. Status post Righttotal hip arthroplasty, anterior approach on 03/21/16.    Assessment: Received [employer] Transition of care referral on 03/26/16. Transition of care follow up not completed due to patient unable to contact and will proceed with case closure.   Plan: RNCM will send case closure due to unable to contact request to [facility].    Paula Gorter H. Gardiner Barefootooper RN, BSN, CCM [facility] Care Management [facility] Telephonic CM Phone: [phone] Fax: [phone]

## 2017-02-13 ENCOUNTER — Ambulatory Visit (INDEPENDENT_AMBULATORY_CARE_PROVIDER_SITE_OTHER): Payer: PRIVATE HEALTH INSURANCE | Admitting: Family Medicine

## 2017-02-13 ENCOUNTER — Encounter: Payer: Self-pay | Admitting: Family Medicine

## 2017-02-13 VITALS — BP 128/70 | HR 79 | Temp 98.2°F | Resp 14 | Ht <= 58 in | Wt 187.4 lb

## 2017-02-13 DIAGNOSIS — G8929 Other chronic pain: Secondary | ICD-10-CM | POA: Diagnosis not present

## 2017-02-13 DIAGNOSIS — Z7689 Persons encountering health services in other specified circumstances: Secondary | ICD-10-CM | POA: Diagnosis not present

## 2017-02-13 DIAGNOSIS — R635 Abnormal weight gain: Secondary | ICD-10-CM | POA: Diagnosis not present

## 2017-02-13 DIAGNOSIS — Z13220 Encounter for screening for lipoid disorders: Secondary | ICD-10-CM

## 2017-02-13 DIAGNOSIS — M25561 Pain in right knee: Secondary | ICD-10-CM

## 2017-02-13 NOTE — Progress Notes (Signed)
Patient presents to clinic today to establish care.  SUBJECTIVE: PMH: Pt is a 66 yo with pmh sig for back pain.  She was formerly seen by [facility] on [address], by Juluis Rainier.  R knee pain: -noticed 1 mo s/p R hip replacement -occasional knee edema -takes aleeve prn -no imaging done -endorses knee making noise and visible veins on lateral side of leg  Back issues: -formerly seen by Rheumatology as was thought 2/2 lupus, but then told not lupus -takes flexeril prn for pain -last xray was in 2017 told arthritis, something about the curvature but not scoliosis.  Wt gain: -pt endorses not being able to be as active 2/2 knee pain -wants to loose wt -Pt thinking about joining a senior's fitness grp -denies issues with thyroid.  Denies constipation, diarrhea, palpitaitons  Allergies: KNDA  PSurgHx: Left bunionectomy Cataract surgery, left  Social: Patient recently moved to [place] 6 years ago from [place]. She has a daughter that lives here. She also has another daughter in [place]. Patient is divorced. Patient is retired from working at [employer], in [employer]. Patient currently works a part-time job at [employer].  She enjoys traveling. Patient denies tobacco, ethanol, drug use.  Family medical history: Mother-deceased, Father deceased Sis- Dois Davenport- arthritis, HTN Brother-Dean-arthritis Sister-Marlowe- arthritis Brother-Jack-deceased, AIDS Brother-Raymond-deceased, lung cancer   Health Maintenance: Dental --[facility] -- Immunizations --tetanus-2008, influenza 2017, TB test 2012 Colonoscopy --2008 PAP -- about 2 or 3 years  Past Medical History:  Diagnosis Date  . Arthritis     Past Surgical History:  Procedure Laterality Date  . left bunionectomy     . left rotator cuff surgery     . TOTAL HIP ARTHROPLASTY Right 03/21/2016   Procedure: RIGHT TOTAL HIP ARTHROPLASTY ANTERIOR APPROACH;  Surgeon: Samson Frederic, MD;   Location: [facility];  Service: Orthopedics;  Laterality: Right;  . UTERINE FIBROID SURGERY      Current Outpatient Prescriptions on File Prior to Visit  Medication Sig Dispense Refill  . aspirin 81 MG chewable tablet Chew 1 tablet (81 mg total) by mouth 2 (two) times daily. 60 tablet 1   No current facility-administered medications on file prior to visit.     No Known Allergies  No family history on file.  Social History   Social History  . Marital status: Divorced    Spouse name: N/A  . Number of children: N/A  . Years of education: N/A   Occupational History  . Not on file.   Social History Main Topics  . Smoking status: Former Games developer  . Smokeless tobacco: Never Used  . Alcohol use Yes     Comment: occasional   . Drug use: No  . Sexual activity: Not on file   Other Topics Concern  . Not on file   Social History Narrative  . No narrative on file    ROS General: Denies fever, chills, night sweats, changes in weight, changes in appetite  +weight gain HEENT: Denies headaches, ear pain, changes in vision, rhinorrhea, sore throat CV: Denies CP, palpitations, SOB, orthopnea Pulm: Denies SOB, cough, wheezing GI: Denies abdominal pain, nausea, vomiting, diarrhea, constipation GU: Denies dysuria, hematuria, frequency, vaginal discharge Msk: Denies muscle cramps, joint pains  +R knee pain, back pain Neuro: Denies weakness, numbness, tingling Skin: Denies rashes, bruising Psych: Denies depression, anxiety, hallucinations  BP 128/70 (BP Location: Left Arm, Patient Position: Sitting, Cuff Size: Normal)   Pulse 79   Temp 98.2  F (36.8 C) (Oral)   Resp 14   Ht 4' 9.5" (1.461 m)   Wt 187 lb 6 oz (85 kg)   SpO2 96%   BMI 39.85 kg/m   Physical Exam Gen. Pleasant, well developed, well-nourished, in NAD HEENT - wearing glasses, [place]/AT, PERRL, EOMI, conjunctive clear, no scleral icterus, no nasal drainage, pharynx without erythema or exudate. Neck: No JVD, no thyromegaly,  no carotid bruits Lungs: no accessory muscle use, CTAB, no wheezes, rales or rhonchi Cardiovascular: RRR, No r/g/m, no peripheral edema Abdomen: BS present, soft, nontender,nondistended, no hepatosplenomegaly Musculoskeletal: No deformities, moves all four extremities, no cyanosis or clubbing, normal tone.  R knee slightly larger than L, crepitus of R knee, no TTP along joint line.  L knee without TTP. Neuro:  A&Ox3, CN II-XII intact, normal gait Skin:  Warm, dry, intact, no lesions Psych: normal affect, mood approrpriate  No results found for this or any previous visit (from the past 2160 hour(s)).  Assessment/Plan: Chronic pain of right knee  -Discussed possibility of arthritis -Advised if knee swells fluid can be removed. Discussed the injections. -will try Tylenol arthritis strength 1000 mg twice a day - Plan: DG Knee Complete 4 Views Right  Screening for cholesterol level  - Plan: Lipid panel  Weight gain  -Discussed exercises that can be done from a chair. Suggested water aerobics -Discussed lifestyle changes including better eating.  - Plan: CBC with Differential/Platelet, Comprehensive metabolic panel, TSH, T4, free, CBC with Differential/Platelet  Encounter to establish care -records release      F/u in 79mo for CPE w/ pap and knee pain

## 2017-02-13 NOTE — Patient Instructions (Addendum)
An Xray of your knee was ordered.  You can stop by the [facility] to have it done.  The address is [address]  [place], [place] [address].  The phone number is [phone].   For your knee pain try Arthritis strength Tylenol.  You can take 1047m twice a day if needed.   If you decide to do this do not take the Aleeve.  Let me know if this works or not.  If it does not we can do other things for your pain. Knee Pain, Adult Many things can cause knee pain. The pain often goes away on its own with time and rest. If the pain does not go away, tests may be done to find out what is causing the pain. Follow these instructions at home: Activity  Rest your knee.  Do not do things that cause pain.  Avoid activities where both feet leave the ground at the same time (high-impact activities). Examples are running, jumping rope, and doing jumping jacks. General instructions  Take medicines only as told by your doctor.  Raise (elevate) your knee when you are resting. Make sure your knee is higher than your heart.  Sleep with a pillow under your knee.  If told, put ice on the knee: ? Put ice in a plastic bag. ? Place a towel between your skin and the bag. ? Leave the ice on for 20 minutes, 2-3 times a day.  Ask your doctor if you should wear an elastic knee support.  Lose weight if you are overweight. Being overweight can make your knee hurt more.  Do not use any tobacco products. These include cigarettes, chewing tobacco, or electronic cigarettes. If you need help quitting, ask your doctor. Smoking may slow down healing. Contact a doctor if:  The pain does not stop.  The pain changes or gets worse.  You have a fever along with knee pain.  Your knee gives out or locks up.  Your knee swells, and becomes worse. Get help right away if:  Your knee feels warm.  You cannot move your knee.  You have very bad knee pain.  You have chest pain.  You have trouble  breathing. Summary  Many things can cause knee pain. The pain often goes away on its own with time and rest.  Avoid activities that put stress on your knee. These include running and jumping rope.  Get help right away if you cannot move your knee, or if your knee feels warm, or if you have trouble breathing. This information is not intended to replace advice given to you by your health care provider. Make sure you discuss any questions you have with your health care provider. Document Released: 07/19/2008 Document Revised: 04/16/2016 Document Reviewed: 04/16/2016 Elsevier Interactive Patient Education  2017 [place], [place] Preventive care refers to lifestyle choices and visits with your health care provider that can promote health and wellness. What does preventive care include?  A yearly physical exam. This is also called an annual well check.  Dental exams once or twice a year.  Routine eye exams. Ask your health care provider how often you should have your eyes checked.  Personal lifestyle choices, including: ? Daily care of your teeth and gums. ? Regular physical activity. ? Eating a healthy diet. ? Avoiding tobacco and drug use. ? Limiting alcohol use. ? Practicing safe sex. ? Taking low-dose aspirin daily starting at age 64 ? Taking vitamin and  mineral supplements as recommended by your health care provider. What happens during an annual well check? The services and screenings done by your health care provider during your annual well check will depend on your age, overall health, lifestyle risk factors, and family history of disease. Counseling Your health care provider may ask you questions about your:  Alcohol use.  Tobacco use.  Drug use.  Emotional well-being.  Home and relationship well-being.  Sexual activity.  Eating habits.  Work and work Statistician.  Method of birth control.  Menstrual cycle.  Pregnancy  history.  Screening You may have the following tests or measurements:  Height, weight, and BMI.  Blood pressure.  Lipid and cholesterol levels. These may be checked every 5 years, or more frequently if you are over 15 years old.  Skin check.  Lung cancer screening. You may have this screening every year starting at age 73 if you have a 30-pack-year history of smoking and currently smoke or have quit within the past 15 years.  Fecal occult blood test (FOBT) of the stool. You may have this test every year starting at age 2.  Flexible sigmoidoscopy or colonoscopy. You may have a sigmoidoscopy every 5 years or a colonoscopy every 10 years starting at age 38.  Hepatitis C blood test.  Hepatitis B blood test.  Sexually transmitted disease (STD) testing.  Diabetes screening. This is done by checking your blood sugar (glucose) after you have not eaten for a while (fasting). You may have this done every 1-3 years.  Mammogram. This may be done every 1-2 years. Talk to your health care provider about when you should start having regular mammograms. This may depend on whether you have a family history of breast cancer.  BRCA-related cancer screening. This may be done if you have a family history of breast, ovarian, tubal, or peritoneal cancers.  Pelvic exam and Pap test. This may be done every 3 years starting at age 31. Starting at age 46, this may be done every 5 years if you have a Pap test in combination with an HPV test.  Bone density scan. This is done to screen for osteoporosis. You may have this scan if you are at high risk for osteoporosis.  Discuss your test results, treatment options, and if necessary, the need for more tests with your health care provider. Vaccines Your health care provider may recommend certain vaccines, such as:  Influenza vaccine. This is recommended every year.  Tetanus, diphtheria, and acellular pertussis (Tdap, Td) vaccine. You may need a Td booster  every 10 years.  Varicella vaccine. You may need this if you have not been vaccinated.  Zoster vaccine. You may need this after age 22.  Measles, mumps, and rubella (MMR) vaccine. You may need at least one dose of MMR if you were born in 1957 or later. You may also need a second dose.  Pneumococcal 13-valent conjugate (PCV13) vaccine. You may need this if you have certain conditions and were not previously vaccinated.  Pneumococcal polysaccharide (PPSV23) vaccine. You may need one or two doses if you smoke cigarettes or if you have certain conditions.  Meningococcal vaccine. You may need this if you have certain conditions.  Hepatitis A vaccine. You may need this if you have certain conditions or if you travel or work in places where you may be exposed to hepatitis A.  Hepatitis B vaccine. You may need this if you have certain conditions or if you travel or work in places where  you may be exposed to hepatitis B.  Haemophilus influenzae type b (Hib) vaccine. You may need this if you have certain conditions.  Talk to your health care provider about which screenings and vaccines you need and how often you need them. This information is not intended to replace advice given to you by your health care provider. Make sure you discuss any questions you have with your health care provider. Document Released: 05/19/2015 Document Revised: 01/10/2016 Document Reviewed: 02/21/2015 Elsevier Interactive Patient Education  2017 [employer].

## 2017-02-14 ENCOUNTER — Ambulatory Visit (INDEPENDENT_AMBULATORY_CARE_PROVIDER_SITE_OTHER)
Admission: RE | Admit: 2017-02-14 | Discharge: 2017-02-14 | Disposition: A | Discharge status: Home / Self Care | Payer: PRIVATE HEALTH INSURANCE | Source: Ambulatory Visit | Attending: Family Medicine | Admitting: Family Medicine

## 2017-02-14 DIAGNOSIS — M25561 Pain in right knee: Secondary | ICD-10-CM | POA: Diagnosis not present

## 2017-02-14 DIAGNOSIS — G8929 Other chronic pain: Secondary | ICD-10-CM

## 2017-02-14 LAB — COMPREHENSIVE METABOLIC PANEL
ALT: 18 U/L (ref 0–35)
AST: 22 U/L (ref 0–37)
Albumin: 4 g/dL (ref 3.5–5.2)
Alkaline Phosphatase: 65 U/L (ref 39–117)
BUN: 15 mg/dL (ref 6–23)
CALCIUM: 9 mg/dL (ref 8.4–10.5)
CHLORIDE: 103 meq/L (ref 96–112)
CO2: 29 meq/L (ref 19–32)
Creatinine, Ser: 0.87 mg/dL (ref 0.40–1.20)
GFR: 84.18 mL/min (ref >60.00)
Glucose, Bld: 89 mg/dL (ref 70–99)
Potassium: 4.6 mEq/L (ref 3.5–5.1)
SODIUM: 141 meq/L (ref 135–145)
Total Bilirubin: 0.3 mg/dL (ref 0.2–1.2)
Total Protein: 6.9 g/dL (ref 6.0–8.3)

## 2017-02-14 LAB — LIPID PANEL
CHOL/HDL RATIO: 4
Cholesterol: 280 mg/dL — ABNORMAL HIGH (ref 0–200)
HDL: 79.3 mg/dL (ref >39.00)
LDL Cholesterol: 179 mg/dL — ABNORMAL HIGH (ref 0–99)
NONHDL: 201.14
Triglycerides: 113 mg/dL (ref 0.0–149.0)
VLDL: 22.6 mg/dL (ref 0.0–40.0)

## 2017-02-14 LAB — CBC WITH DIFFERENTIAL/PLATELET
BASOS ABS: 0 10*3/uL (ref 0.0–0.1)
Basophils Relative: 0.7 % (ref 0.0–3.0)
EOS PCT: 8.3 % — AB (ref 0.0–5.0)
Eosinophils Absolute: 0.5 10*3/uL (ref 0.0–0.7)
HCT: 44.1 % (ref 36.0–46.0)
HEMOGLOBIN: 14.1 g/dL (ref 12.0–15.0)
Lymphocytes Relative: 49.2 % — ABNORMAL HIGH (ref 12.0–46.0)
Lymphs Abs: 3.2 10*3/uL (ref 0.7–4.0)
MCHC: 32 g/dL (ref 30.0–36.0)
MCV: 88.9 fl (ref 78.0–100.0)
MONO ABS: 0.6 10*3/uL (ref 0.1–1.0)
Monocytes Relative: 9.3 % (ref 3.0–12.0)
Neutro Abs: 2.1 10*3/uL (ref 1.4–7.7)
Neutrophils Relative %: 32.5 % — ABNORMAL LOW (ref 43.0–77.0)
Platelets: 310 10*3/uL (ref 150.0–400.0)
RBC: 4.96 Mil/uL (ref 3.87–5.11)
RDW: 14.5 % (ref 11.5–15.5)
WBC: 6.6 10*3/uL (ref 4.0–10.5)

## 2017-02-14 LAB — TSH: TSH: 1.4 u[IU]/mL (ref 0.35–4.50)

## 2017-02-14 LAB — T4, FREE: FREE T4: 0.99 ng/dL (ref 0.60–1.60)

## 2017-02-19 ENCOUNTER — Encounter: Payer: Self-pay | Admitting: Emergency Medicine

## 2017-02-20 ENCOUNTER — Other Ambulatory Visit: Payer: Self-pay | Admitting: Family Medicine

## 2017-02-20 DIAGNOSIS — Z1231 Encounter for screening mammogram for malignant neoplasm of breast: Secondary | ICD-10-CM

## 2017-02-27 ENCOUNTER — Telehealth: Payer: Self-pay | Admitting: Family Medicine

## 2017-02-27 NOTE — Telephone Encounter (Signed)
Pt would like blood work results °

## 2017-02-27 NOTE — Telephone Encounter (Signed)
Spoke with patient regarding lab and x ray results. Patient understood and states that she has been exercising and eating healthy and would like to know if PCP could prescribe a medication to help suppress patient appetite? Please advise.

## 2017-02-28 NOTE — Telephone Encounter (Signed)
Pt should try diet changes and exercise first.

## 2017-03-03 NOTE — Telephone Encounter (Signed)
Left a VM for patient to give the office a call back.  

## 2017-03-05 ENCOUNTER — Encounter: Payer: Self-pay | Admitting: Family Medicine

## 2017-03-05 ENCOUNTER — Ambulatory Visit (INDEPENDENT_AMBULATORY_CARE_PROVIDER_SITE_OTHER): Payer: PRIVATE HEALTH INSURANCE | Admitting: Family Medicine

## 2017-03-05 VITALS — BP 130/70 | HR 66 | Temp 98.3°F | Wt 189.8 lb

## 2017-03-05 DIAGNOSIS — Z23 Encounter for immunization: Secondary | ICD-10-CM

## 2017-03-05 DIAGNOSIS — M1711 Unilateral primary osteoarthritis, right knee: Secondary | ICD-10-CM

## 2017-03-05 DIAGNOSIS — Z6838 Body mass index (BMI) 38.0-38.9, adult: Secondary | ICD-10-CM | POA: Diagnosis not present

## 2017-03-05 MED ORDER — DICLOFENAC SODIUM 75 MG PO TBEC: 75.0000 mg | DELAYED_RELEASE_TABLET | Freq: Two times a day (BID) | ORAL | 0 refills | Status: DC

## 2017-03-05 NOTE — Progress Notes (Signed)
Subjective:    Patient ID: Paula Hardy, female    DOB: 07/09/1950, 66 y.o.   MRN: [id number]  No chief complaint on file.   HPI Patient was seen today for f/u on knee pain and wt gain.  R knee pain: -R knee xrays 02/14/17 with tricompartmental oa -Pt has been taking NSAIDs with little relief. -pt unable to walk her dog as much 2/2 knee pain.  Wt gain: -Pt wants to loose wt. -has decreased carbs, does not drink sodas, and has increased veggies -was going to the gym.  May get on treadmill x 20-30 min before knee starts hurting -looked into water aerobics, but classes are held when pt is at work. -pt expresses concerns about having HTN or HLD 2/2 increased wt.     Past Medical History:  Diagnosis Date  . Arthritis     No Known Allergies  ROS General: Denies fever, chills, night sweats, changes in weight, changes in appetite  + wt gain. HEENT: Denies headaches, ear pain, changes in vision, rhinorrhea, sore throat CV: Denies CP, palpitations, SOB, orthopnea Pulm: Denies SOB, cough, wheezing GI: Denies abdominal pain, nausea, vomiting, diarrhea, constipation GU: Denies dysuria, hematuria, frequency, vaginal discharge Msk: Denies muscle cramps   +R knee pain Neuro: Denies weakness, numbness, tingling Skin: Denies rashes, bruising Psych: Denies depression, anxiety, hallucinations     Objective:    Blood pressure 130/70, pulse 66, temperature 98.3 F (36.8 C), temperature source Oral, weight 189 lb 12.8 oz (86.1 kg).   Gen. Pleasant, well-nourished, in no distress, normal affect HEENT: [place]/AT, face symmetric, no scleral icterus, PERRLA, nares patent without drainage, pharynx without erythema or exudate. Lungs: no accessory muscle use, CTAB, no wheezes or rales Cardiovascular: RRR, no m/r/g, no peripheral edema Musculoskeletal: No deformities, no cyanosis or clubbing, normal tone Neuro:  A&Ox3, CN II-XII intact, normal gait Skin:  Warm, no lesions/ rash   Wt  Readings from Last 3 Encounters:  03/05/17 189 lb 12.8 oz (86.1 kg)  02/13/17 187 lb 6 oz (85 kg)  03/21/16 170 lb (77.1 kg)    Lab Results  Component Value Date   WBC 6.6 02/13/2017   HGB 14.1 02/13/2017   HCT 44.1 02/13/2017   PLT 310.0 02/13/2017   GLUCOSE 89 02/13/2017   CHOL 280 (H) 02/13/2017   TRIG 113.0 02/13/2017   HDL 79.30 02/13/2017   LDLCALC 179 (H) 02/13/2017   ALT 18 02/13/2017   AST 22 02/13/2017   NA 141 02/13/2017   K 4.6 02/13/2017   CL 103 02/13/2017   CREATININE 0.87 02/13/2017   BUN 15 02/13/2017   CO2 29 02/13/2017   TSH 1.40 02/13/2017    Assessment/Plan:  Primary osteoarthritis of right knee  -Tricompartmental osteoarthritis noted on x-ray from previous OFV -Discussed various options for pain control. -Patient wishes to try topical analgesic -Will try Voltaren gel BID prn. -If voltaren gel does not work will consider steroid knee injections. - Plan: diclofenac (VOLTAREN) 75 MG EC tablet  BMI 38.0-38.9,adult -discussed exercise programs designed for seniors. Patient to try [employer] online -patient given pamphlet on nutritional information for area restaurants -Discussed increasing po intake of water. -Will continue lifestyle modifications for next few months. -will continue to monitor bp at each visit.  Need for influenza vaccination -influenza vaccine given   F/u in 1 month. Sooner if needed.  Pt also needs to schedule pap.

## 2017-03-05 NOTE — Telephone Encounter (Signed)
Patient is here for her 1 month follow up and discussed what Dr. Salomon FickBanks suggested in phone note about trying diet changes and exercise . Patient is going to discuss this issue further with PCP.

## 2017-03-05 NOTE — Patient Instructions (Addendum)
Exercising to Lose Weight Exercising can help you to lose weight. In order to lose weight through exercise, you need to do vigorous-intensity exercise. You can tell that you are exercising with vigorous intensity if you are breathing very hard and fast and cannot hold a conversation while exercising. Moderate-intensity exercise helps to maintain your current weight. You can tell that you are exercising at a moderate level if you have a higher heart rate and faster breathing, but you are still able to hold a conversation. How often should I exercise? Choose an activity that you enjoy and set realistic goals. Your health care provider can help you to make an activity plan that works for you. Exercise regularly as directed by your health care provider. This may include:  Doing resistance training twice each week, such as: ? Push-ups. ? Sit-ups. ? Lifting weights. ? Using resistance bands.  Doing a given intensity of exercise for a given amount of time. Choose from these options: ? 150 minutes of moderate-intensity exercise every week. ? 75 minutes of vigorous-intensity exercise every week. ? A mix of moderate-intensity and vigorous-intensity exercise every week.  Children, pregnant women, people who are out of shape, people who are overweight, and older adults may need to consult a health care provider for individual recommendations. If you have any sort of medical condition, be sure to consult your health care provider before starting a new exercise program. What are some activities that can help me to lose weight?  Walking at a rate of at least 4.5 miles an hour.  Jogging or running at a rate of 5 miles per hour.  Biking at a rate of at least 10 miles per hour.  Lap swimming.  Roller-skating or in-line skating.  Cross-country skiing.  Vigorous competitive sports, such as football, basketball, and soccer.  Jumping rope.  Aerobic dancing. How can I be more active in my day-to-day  activities?  Use the stairs instead of the elevator.  Take a walk during your lunch break.  If you drive, park your car farther away from work or school.  If you take public transportation, get off one stop early and walk the rest of the way.  Make all of your phone calls while standing up and walking around.  Get up, stretch, and walk around every 30 minutes throughout the day. What guidelines should I follow while exercising?  Do not exercise so much that you hurt yourself, feel dizzy, or get very short of breath.  Consult your health care provider prior to starting a new exercise program.  Wear comfortable clothes and shoes with good support.  Drink plenty of water while you exercise to prevent dehydration or heat stroke. Body water is lost during exercise and must be replaced.  Work out until you breathe faster and your heart beats faster. This information is not intended to replace advice given to you by your health care provider. Make sure you discuss any questions you have with your health care provider. Document Released: 05/25/2010 Document Revised: 09/28/2015 Document Reviewed: 09/23/2013 Elsevier Interactive Patient Education  2018 [employer].  What You Need to Know About Osteoarthritis Osteoarthritis is a type of arthritis that affects tissue that covers the ends of bones in joints (cartilage). Cartilage acts as a cushion between the bones and helps them move smoothly. Osteoarthritis results when cartilage in the joints gets worn down. Osteoarthritis is sometimes called "wear and tear" arthritis. Osteoarthritis can affect any joint and can make movement painful. Hips, knees, fingers,  and toes are some of the joints that are most often affected by osteoarthritis. You may be more likely to develop osteoarthritis if:  You are middle-aged or older.  You are obese.  You have injured a joint or had surgery on a joint.  You have a family history of  osteoarthritis.  How can osteoarthritis affect me? Osteoarthritis can cause:  Pain and swelling in your joint.  Difficulty moving your joint.  A grating or scraping feeling inside the joint when you move it.  Popping or creaking sounds when you move.  This condition can make it harder to do things that you need or want to do each day. Osteoarthritis in a major joint, such as your knee or hip, can make it painful to walk or exercise. If you have osteoarthritis in your hands, you might not be able to grip items, twist your hand, or control small movements of your hands and fingers (fine motor skills). Over time, osteoarthritis could cause you to be less physically active. Being less active increases your risk for other long-term (chronic) health problems, such as diabetes and heart disease. What lifestyle changes can be made? You can lessen the impact that osteoarthritis has on your daily life by:  Switching to low-impact activities that do not put repeated pressure on your joints. For example, if you usually run or jog for exercise, try swimming or riding a bike instead.  Staying active. Build up to at least 150 minutes of physical activity each week to keep your joints healthy and keep your body strong.  Losing weight. If you are overweight or obese, losing weight can take pressure off of your joints. If you need help with weight loss, talk with your health care provider or a diet and nutrition specialist (dietitian).  What other changes can be made? You can also lessen the effect of osteoarthritis by:  Using assistive devices. Sometimes a brace, wrap, splint, specialized glove, or cane can help. Talk with your health care provider or physical therapist about when and how to use these.  Working with a physical therapist who can help you find ways to do your daily activities without harming your joints. A physical therapist can also teach you exercises and stretches to strengthen the  muscles that support your joints.  Treating pain and inflammation. Take over-the-counter and prescription medicines for pain and inflammation only as told by your health care provider. If directed, you may put ice on the affected joint: ? If you have a removable assistive device, remove it as told by your health care provider. ? Put ice in a plastic bag. ? Place a towel between your skin and the bag. ? Leave the ice on for 20 minutes, 2-3 times a day.  If other measures do not work, you may need joint surgery, such as joint replacement. What can happen if changes are not made? Osteoarthritis is a condition that gets worse over time (a progressive condition). If you do not take steps to strengthen your body and to slow down the progress of the disease, your condition may get worse more quickly. Your joints may stiffen and become swollen, which will make them painful and hard to move. Where to find more information: You can learn more about osteoarthritis from:  The Arthritis Foundation: www.http://www.ingram.com/  [employer] of Arthritis and Musculoskeletal and Skin Diseases: www.niams.https://www.ruiz-smith.com/  Contact a health care provider if:  You cannot do your normal activities comfortably.  Your joint does not function at all.  Your pain is interfering with your sleep.  You are gaining weight.  Your joint appears to be changing in shape, instead of just being swollen and sore. Summary  Osteoarthritis is a painful joint disease that gets worse over time.  This condition can lead to other long-term (chronic) health problems.  There are changes that you can make to slow down the progression of the disease. This information is not intended to replace advice given to you by your health care provider. Make sure you discuss any questions you have with your health care provider. Document Released: 12/12/2015  Document Revised: 12/14/2015 Document Reviewed: 12/12/2015 Elsevier Interactive Patient Education  2018 [employer]  Osteoarthritis Osteoarthritis is a type of arthritis that affects tissue that covers the ends of bones in joints (cartilage). Cartilage acts as a cushion between the bones and helps them move smoothly. Osteoarthritis results when cartilage in the joints gets worn down. Osteoarthritis is sometimes called "wear and tear" arthritis. Osteoarthritis is the most common form of arthritis. It often occurs in older people. It is a condition that gets worse over time (a progressive condition). Joints that are most often affected by this condition are in:  Fingers.  Toes.  Hips.  Knees.  Spine, including neck and lower back.  What are the causes? This condition is caused by age-related wearing down of cartilage that covers the ends of bones. What increases the risk? The following factors may make you more likely to develop this condition:  Older age.  Being overweight or obese.  Overuse of joints, such as in athletes.  Past injury of a joint.  Past surgery on a joint.  Family history of osteoarthritis.  What are the signs or symptoms? The main symptoms of this condition are pain, swelling, and stiffness in the joint. The joint may lose its shape over time. Small pieces of bone or cartilage may break off and float inside of the joint, which may cause more pain and damage to the joint. Small deposits of bone (osteophytes) may grow on the edges of the joint. Other symptoms may include:  A grating or scraping feeling inside the joint when you move it.  Popping or creaking sounds when you move.  Symptoms may affect one or more joints. Osteoarthritis in a major joint, such as your knee or hip, can make it painful to walk or exercise. If you have osteoarthritis in your hands, you might not be able to grip items, twist your hand, or control small movements of your hands and  fingers (fine motor skills). How is this diagnosed? This condition may be diagnosed based on:  Your medical history.  A physical exam.  Your symptoms.  X-rays of the affected joint(s).  Blood tests to rule out other types of arthritis.  How is this treated? There is no cure for this condition, but treatment can help to control pain and improve joint function. Treatment plans may include:  A prescribed exercise program that allows for rest and joint relief. You may work with a physical therapist.  A weight control plan.  Pain relief techniques, such as: ? Applying heat and cold to the joint. ? Electric pulses delivered to nerve endings under the skin (transcutaneous electrical nerve stimulation, or TENS). ? Massage. ? Certain nutritional supplements.  NSAIDs or prescription medicines to help relieve pain.  Medicine to help relieve pain and inflammation (corticosteroids). This can be given by mouth (orally) or as an injection.  Assistive devices, such as a brace, wrap,  splint, specialized glove, or cane.  Surgery, such as: ? An osteotomy. This is done to reposition the bones and relieve pain or to remove loose pieces of bone and cartilage. ? Joint replacement surgery. You may need this surgery if you have very bad (advanced) osteoarthritis.  Follow these instructions at home: Activity  Rest your affected joints as directed by your health care provider.  Do not drive or use heavy machinery while taking prescription pain medicine.  Exercise as directed. Your health care provider or physical therapist may recommend specific types of exercise, such as: ? Strengthening exercises. These are done to strengthen the muscles that support joints that are affected by arthritis. They can be performed with weights or with exercise bands to add resistance. ? Aerobic activities. These are exercises, such as brisk walking or water aerobics, that get your heart pumping. ? Range-of-motion  activities. These keep your joints easy to move. ? Balance and agility exercises. Managing pain, stiffness, and swelling  If directed, apply heat to the affected area as often as told by your health care provider. Use the heat source that your health care provider recommends, such as a moist heat pack or a heating pad. ? If you have a removable assistive device, remove it as told by your health care provider. ? Place a towel between your skin and the heat source. If your health care provider tells you to keep the assistive device on while you apply heat, place a towel between the assistive device and the heat source. ? Leave the heat on for 20-30 minutes. ? Remove the heat if your skin turns bright red. This is especially important if you are unable to feel pain, heat, or cold. You may have a greater risk of getting burned.  If directed, put ice on the affected joint: ? If you have a removable assistive device, remove it as told by your health care provider. ? Put ice in a plastic bag. ? Place a towel between your skin and the bag. If your health care provider tells you to keep the assistive device on during icing, place a towel between the assistive device and the bag. ? Leave the ice on for 20 minutes, 2-3 times a day. General instructions  Take over-the-counter and prescription medicines only as told by your health care provider.  Maintain a healthy weight. Follow instructions from your health care provider for weight control. These may include dietary restrictions.  Do not use any products that contain nicotine or tobacco, such as cigarettes and e-cigarettes. These can delay bone healing. If you need help quitting, ask your health care provider.  Use assistive devices as directed by your health care provider.  Keep all follow-up visits as told by your health care provider. This is important. Where to find more information:  [employer] of Arthritis and Musculoskeletal and Skin  Diseases: www.niams.http://www.myers.net/  [employer] on Aging: https://walker.com/  American College of Rheumatology: www.rheumatology.org Contact a health care provider if:  Your skin turns red.  You develop a rash.  You have pain that gets worse.  You have a fever along with joint or muscle aches. Get help right away if:  You lose a lot of weight.  You suddenly lose your appetite.  You have night sweats. Summary  Osteoarthritis is a type of arthritis that affects tissue covering the ends of bones in joints (cartilage).  This condition is caused by age-related wearing down of cartilage that covers the ends of bones.  The  main symptom of this condition is pain, swelling, and stiffness in the joint.  There is no cure for this condition, but treatment can help to control pain and improve joint function. This information is not intended to replace advice given to you by your health care provider. Make sure you discuss any questions you have with your health care provider. Document Released: 04/22/2005 Document Revised: 12/25/2015 Document Reviewed: 12/25/2015 Elsevier Interactive Patient Education  [employer].

## 2017-03-07 ENCOUNTER — Telehealth: Payer: Self-pay | Admitting: Family Medicine

## 2017-03-07 ENCOUNTER — Encounter: Payer: Self-pay | Admitting: Family Medicine

## 2017-03-07 ENCOUNTER — Ambulatory Visit (INDEPENDENT_AMBULATORY_CARE_PROVIDER_SITE_OTHER): Payer: PRIVATE HEALTH INSURANCE | Admitting: Family Medicine

## 2017-03-07 VITALS — BP 150/80 | HR 81 | Ht <= 58 in | Wt 190.0 lb

## 2017-03-07 DIAGNOSIS — K625 Hemorrhage of anus and rectum: Secondary | ICD-10-CM

## 2017-03-07 LAB — CBC
HEMATOCRIT: 38.3 % (ref 35.0–45.0)
HEMOGLOBIN: 12.9 g/dL (ref 11.7–15.5)
MCH: 28.4 pg (ref 27.0–33.0)
MCHC: 33.7 g/dL (ref 32.0–36.0)
MCV: 84.2 fL (ref 80.0–100.0)
MPV: 11.3 fL (ref 7.5–12.5)
Platelets: 319 10*3/uL (ref 140–400)
RBC: 4.55 10*6/uL (ref 3.80–5.10)
RDW: 12.5 % (ref 11.0–15.0)
WBC: 7.9 10*3/uL (ref 3.8–10.8)

## 2017-03-07 MED ORDER — OMEPRAZOLE 40 MG PO CPDR: 40.0000 mg | DELAYED_RELEASE_CAPSULE | Freq: Every day | ORAL | 3 refills | Status: DC

## 2017-03-07 NOTE — Telephone Encounter (Signed)
Received stat CBC report from oncall service  Lab Results  Component Value Date   WBC 7.9 03/07/2017   HGB 12.9 03/07/2017   HCT 38.3 03/07/2017   MCV 84.2 03/07/2017   PLT 319 03/07/2017   All ok., although hg is lower than previous- will forward to ordering provider

## 2017-03-07 NOTE — Patient Instructions (Signed)
Your exam today is normal.  We will check blood work.  We will send you to the gastroenterologist.  Take care,  Dr Jimmey RalphParker

## 2017-03-07 NOTE — Telephone Encounter (Signed)
Patient Name: Paula DavidsonCARLIS Paula Hardy  DOB: 1950/06/17    Initial Comment Caller states has blood in stool, taking a new med   Nurse Assessment  Nurse: Willeen CassBennett, RN, Lelon MastSamantha Date/Time (Eastern Time): 03/07/2017 1:59:51 PM  Confirm and document reason for call. If symptomatic, describe symptoms. ---Caller states she began taking diclofenac yesterday, was reading side effects and they scared her. Caller states she had red blood in stool.  Does the patient have any new or worsening symptoms? ---Yes  Will a triage be completed? ---Yes  Related visit to physician within the last 2 weeks? ---Yes  Does the PT have any chronic conditions? (i.e. diabetes, asthma, etc.) ---Unknown  Is this a behavioral health or substance abuse call? ---No     Guidelines    Guideline Title Affirmed Question Affirmed Notes  Rectal Bleeding MODERATE rectal bleeding (small blood clots, passing blood without stool, or toilet water turns red)    Final Disposition User   See Physician within 24 Hours AlleeneBennett, RN, Lelon MastSamantha    Comments  pt began taking diclofenac for knee pain  [facility] [place] [facility] [phone] & obtained appt at 420 with prescriber Jacquiline Doealeb Parker.   Referrals  GO TO FACILITY OTHER - SPECIFY   Caller Disagree/Comply Comply  Caller Understands Yes  PreDisposition InappropriateToAsk

## 2017-03-07 NOTE — Progress Notes (Signed)
    Subjective:  Paula Hardy is a 66 y.o. female who presents today with a chief complaint of rectal bleeding.   HPI:  Rectal Bleeding, acute issue Symptoms started this morning.  Patient noticed bright red blood in her bowel movement.  She had 2 episodes of this.  Patient started diclofenac 2 days ago for osteoarthritis.  She has had one bowel movement since this morning without any blood.  Denies any abdominal pain.  No fevers or chills.  No diarrhea.  No constipation.  No melena.  No history of GI bleed.  No history of hemorrhoids.  No history of reflux or indigestion.  No dizziness or syncope.  No chest pain or shortness of breath.  ROS: Per HPI  PMH: Smoking history reviewed. Former smoker.   Objective:  Physical Exam: BP (!) 150/80   Pulse 81   Ht 4' 9.5" (1.461 m)   Wt 190 lb (86.2 kg)   SpO2 98%   BMI 40.40 kg/m   Gen: NAD, resting comfortably CV: RRR with no murmurs appreciated Pulm: NWOB, CTAB with no crackles, wheezes, or rhonchi GI: Normal bowel sounds present. Soft, Nontender, Nondistended. GU: No gross blood. No anal fissures or hemorrhoids.  MSK: No edema, cyanosis, or clubbing noted Skin: Warm, dry Neuro: Grossly normal, moves all extremities Psych: Normal affect and thought content  Assessment/Plan:  Rectal bleeding Hemodynamically stable.  Likely lower GI given bright red blood.  Her exam today was normal.  Will check stat CBC.  Stop diclofenac.  Start omeprazole.  Referral to GI placed-patient will likely need endoscopic evaluation.  Strict return precautions reviewed including worsening bleeding, abdominal pain, chest pain, shortness of breath, dizziness, and syncope.   Katina Degreealeb M. Jimmey RalphParker, MD 03/07/2017 4:36 PM

## 2017-03-07 NOTE — Telephone Encounter (Signed)
Noted  

## 2017-03-11 ENCOUNTER — Telehealth: Payer: Self-pay | Admitting: Family Medicine

## 2017-03-11 ENCOUNTER — Ambulatory Visit
Admission: RE | Admit: 2017-03-11 | Discharge: 2017-03-11 | Disposition: A | Discharge status: Home / Self Care | Payer: PRIVATE HEALTH INSURANCE | Source: Ambulatory Visit | Attending: Family Medicine | Admitting: Family Medicine

## 2017-03-11 DIAGNOSIS — Z1231 Encounter for screening mammogram for malignant neoplasm of breast: Secondary | ICD-10-CM

## 2017-03-11 NOTE — Telephone Encounter (Signed)
Patient would like a call back Re labs.  Ty,  -LL

## 2017-03-12 NOTE — Telephone Encounter (Signed)
Tried calling pt with NA. 

## 2017-03-13 ENCOUNTER — Encounter: Payer: Self-pay | Admitting: Gastroenterology

## 2017-03-13 ENCOUNTER — Ambulatory Visit (INDEPENDENT_AMBULATORY_CARE_PROVIDER_SITE_OTHER): Payer: PRIVATE HEALTH INSURANCE | Admitting: Gastroenterology

## 2017-03-13 VITALS — BP 142/80 | HR 84 | Ht <= 58 in | Wt 187.4 lb

## 2017-03-13 DIAGNOSIS — K625 Hemorrhage of anus and rectum: Secondary | ICD-10-CM

## 2017-03-13 DIAGNOSIS — K648 Other hemorrhoids: Secondary | ICD-10-CM

## 2017-03-13 MED ORDER — SUPREP BOWEL PREP KIT 17.5-3.13-1.6 GM/177ML PO SOLN: ORAL | 0 refills | Status: DC

## 2017-03-13 NOTE — Progress Notes (Signed)
HPI :  66 y/o female with a history of arthritis, referred by Dr. Dimas Chyle for rectal bleeding.  Patient reports she has chronic knee pain for which she took a few doses of diclofenac last week. She reports shortly after starting the she had a bowel movement during which she passed a moderate volume of red blood in the toilet. She reported the bleeding was painless. She denies any constipation or diarrhea, she denies any abdominal pains. She is eating well without any nausea or vomiting. She's never had any prior bleeding issues such as this before. She denies any family history of colon cancer. Since his initial episode she has not had any further bleeding episodes and is passing brown stool. Had a normal colonoscopy in 2010.  Labs as below. Hgb on 11/2 was 12.9    Past Medical History:  Diagnosis Date  . Arthritis      Past Surgical History:  Procedure Laterality Date  . BUNIONECTOMY Left   . ROTATOR CUFF REPAIR Left   . UTERINE FIBROID SURGERY     Family History  Problem Relation Age of Onset  . Lung cancer Brother   . HIV/AIDS Brother    Social History   Tobacco Use  . Smoking status: Former Smoker    Last attempt to quit: 03/14/1987    Years since quitting: 30.0  . Smokeless tobacco: Never Used  Substance Use Topics  . Alcohol use: Yes    Comment: occasional   . Drug use: No   Current Outpatient Medications  Medication Sig Dispense Refill  . aspirin 81 MG chewable tablet Chew 1 tablet (81 mg total) by mouth 2 (two) times daily. 60 tablet 1  . B COMPLEX-C-FOLIC ACID PO Take 1 tablet by mouth daily.    . Biotin w/ Vitamins C & E (HAIR/SKIN/NAILS PO) Take 2,500 mg by mouth daily.    . Multiple Vitamins-Minerals (MULTIVITAL) CHEW Chew 1 tablet by mouth daily.    . Multiple Vitamins-Minerals-FA (ACTIVITE EC PO) Take 2 tablets by mouth daily.    [place] BOWEL PREP KIT 17.5-3.13-1.6 GM/177ML SOLN Suprep-Use as directed 354 mL 0   No current facility-administered  medications for this visit.    No Known Allergies   Review of Systems: All systems reviewed and negative except where noted in HPI.    Dg Knee Complete 4 Views Right  Result Date: 02/14/2017 CLINICAL DATA:  Chronic knee pain. EXAM: RIGHT KNEE - COMPLETE 4+ VIEW COMPARISON:  No recent prior . FINDINGS: Small knee joint effusion. Degenerative changes right knee. No acute bony or joint abnormality identified. No evidence fracture or dislocation. IMPRESSION: Small knee joint effusion. Tricompartment degenerative change. No acute abnormality identified. Electronically Signed   By: Marcello Moores  Register   On: 02/14/2017 14:04   Mm Digital Screening Bilateral  Result Date: 03/12/2017 CLINICAL DATA:  Screening. EXAM: DIGITAL SCREENING BILATERAL MAMMOGRAM WITH CAD COMPARISON:  Previous exam(s). ACR Breast Density Category a: The breast tissue is almost entirely fatty. FINDINGS: There are no findings suspicious for malignancy. Images were processed with CAD. IMPRESSION: No mammographic evidence of malignancy. A result letter of this screening mammogram will be mailed directly to the patient. RECOMMENDATION: Screening mammogram in one year. (Code:SM-B-01Y) BI-RADS CATEGORY  1: Negative. Electronically Signed   By: Nolon Nations M.D.   On: 03/12/2017 09:14    Physical Exam: BP (!) 142/80 (BP Location: Left Arm, Patient Position: Sitting, Cuff Size: Normal)   Pulse 84   Ht 4' 10"  (1.473  m) Comment: height meaured without shoes  Wt 187 lb 6 oz (85 kg)   BMI 39.16 kg/m  Constitutional: Pleasant,well-developed, female in no acute distress. HEENT: Normocephalic and atraumatic. Conjunctivae are normal. No scleral icterus. Neck supple.  Cardiovascular: Normal rate, regular rhythm.  Pulmonary/chest: Effort normal and breath sounds normal. No wheezing, rales or rhonchi. Abdominal: Soft, nondistended, nontender.  There are no masses palpable. No hepatomegaly. DRE / Anoscopy - small internal hemorrhoids, no  fissure, no mass lesion Extremities: no edema Lymphadenopathy: No cervical adenopathy noted. Neurological: Alert and oriented to person place and time. Skin: Skin is warm and dry. No rashes noted. Psychiatric: Normal mood and affect. Behavior is normal.   ASSESSMENT AND PLAN: 66 year old relatively healthy female presenting following one episode of passing moderate volume of blood per rectum. Her hemoglobin is normal. Rectal examination anoscopy was remarkable for small internal hemorrhoids, otherwise negative.  I counseled her that more than likely her bleeding was anorectal in etiology, likely due to hemorrhoids, however given her last colonoscopy was almost 9 years ago, I'm recommending a colonoscopy to further evaluate these symptoms and ensure no bleeding polyp or mass lesion. I discussed risks benefits of colonoscopy and anesthesia with her and she wanted to proceed. Further recommendations pending results of this exam. She agreed with the plan.  [place] Cellar, MD [facility] Pager [phone]  CC: Vivi Barrack, MD

## 2017-03-13 NOTE — Patient Instructions (Addendum)
If you are age 64 or older, your body mass index should be between 23-30. Your Body mass index is 39.16 kg/m. If this is out of the aforementioned range listed, please consider follow up with your Primary Care Provider.  If you are age 64 or younger, your body mass index should be between 19-25. Your Body mass index is 39.16 kg/m. If this is out of the aformentioned range listed, please consider follow up with your Primary Care Provider.   You have been scheduled for a colonoscopy. Please follow written instructions given to you at your visit today.  Please pick up your prep supplies at the pharmacy within the next 1-3 days. If you use inhalers (even only as needed), please bring them with you on the day of your procedure. Your physician has requested that you go to www.startemmi.com and enter the access code given to you at your visit today. This web site gives a general overview about your procedure. However, you should still follow specific instructions given to you by our office regarding your preparation for the procedure.    Thank you.

## 2017-03-17 ENCOUNTER — Ambulatory Visit: Payer: PRIVATE HEALTH INSURANCE | Admitting: Family Medicine

## 2017-03-24 ENCOUNTER — Encounter: Payer: PRIVATE HEALTH INSURANCE | Admitting: Gastroenterology

## 2017-04-04 ENCOUNTER — Ambulatory Visit (INDEPENDENT_AMBULATORY_CARE_PROVIDER_SITE_OTHER): Payer: PRIVATE HEALTH INSURANCE | Admitting: Family Medicine

## 2017-04-04 ENCOUNTER — Encounter: Payer: Self-pay | Admitting: Family Medicine

## 2017-04-04 VITALS — BP 130/80 | HR 97 | Temp 98.1°F | Wt 190.8 lb

## 2017-04-04 DIAGNOSIS — M1711 Unilateral primary osteoarthritis, right knee: Secondary | ICD-10-CM | POA: Diagnosis not present

## 2017-04-04 DIAGNOSIS — J00 Acute nasopharyngitis [common cold]: Secondary | ICD-10-CM

## 2017-04-04 MED ORDER — DICLOFENAC SODIUM 1 % TD GEL: 2.0000 g | Freq: Three times a day (TID) | TRANSDERMAL | 3 refills | Status: DC | PRN

## 2017-04-04 NOTE — Progress Notes (Signed)
Subjective:    Patient ID: Paula Hardy, female    DOB: [birth year], 66 y.o.   MRN: [id number]  Chief Complaint  Patient presents with  . Follow-up    HPI Patient was seen today for f/u and acute visit.  Chest congestion, dry cough, slight HA, rhinorrhea, and sore throat since yesterday.  Pt purchased Mucinex, but has yet to use it.  Pt with continued R knee pain 2/2 OA.  Took Diclofenac po, had rectal bleeding.  Has seen GI.  Colonoscopy was recommended but the bowel prep was >$120.  Pt considering re-scheduling appt.    Past Medical History:  Diagnosis Date  . Arthritis     No Known Allergies  ROS General: Denies fever, chills, night sweats, changes in weight, changes in appetite HEENT: Denies headaches, ear pain, changes in vision, rhinorrhea, sore throat  +Chest congestion, dry cough, sore throat, rhinorrhea CV: Denies CP, palpitations, SOB, orthopnea Pulm: Denies SOB, cough, wheezing GI: Denies abdominal pain, nausea, vomiting, diarrhea, constipation GU: Denies dysuria, hematuria, frequency, vaginal discharge Msk: Denies muscle cramps, joint pains  +R knee pain Neuro: Denies weakness, numbness, tingling Skin: Denies rashes, bruising Psych: Denies depression, anxiety, hallucinations     Objective:    Blood pressure 130/80, pulse 97, temperature 98.1 F (36.7 C), temperature source Oral, weight 190 lb 12.8 oz (86.5 kg).   Gen. Pleasant, well-nourished, in no distress, normal affect  HEENT: [place]/AT, face symmetric, conjunctiva clear, no scleral icterus, PERRLA, EOMI, nares patent without drainage, pharynx without erythema or exudate. Lungs: no accessory muscle use, CTAB, no wheezes or rales Cardiovascular: RRR, no m/r/g, no peripheral edema Neuro:  A&Ox3, CN II-XII intact, normal gait Skin:  Warm, no lesions/ rash   Wt Readings from Last 3 Encounters:  04/04/17 190 lb 12.8 oz (86.5 kg)  03/13/17 187 lb 6 oz (85 kg)  03/07/17 190 lb (86.2 kg)    Lab Results   Component Value Date   WBC 7.9 03/07/2017   HGB 12.9 03/07/2017   HCT 38.3 03/07/2017   PLT 319 03/07/2017   GLUCOSE 89 02/13/2017   CHOL 280 (H) 02/13/2017   TRIG 113.0 02/13/2017   HDL 79.30 02/13/2017   LDLCALC 179 (H) 02/13/2017   ALT 18 02/13/2017   AST 22 02/13/2017   NA 141 02/13/2017   K 4.6 02/13/2017   CL 103 02/13/2017   CREATININE 0.87 02/13/2017   BUN 15 02/13/2017   CO2 29 02/13/2017   TSH 1.40 02/13/2017    Assessment/Plan:  Primary osteoarthritis of right knee -pt declines steroid injection at this time.  - Plan: diclofenac sodium (VOLTAREN) 1 % GEL - Pt advised to d/c med if has any recurrent rectal bleeding.  Acute nasopharyngitis  -supportive care -RTC precautions given.  Pt encouraged to reschedule appt with GI for Colonoscopy.  Pt encouraged to ask if there is a more cost effective bowel prep that can be used.   F/u in 3 months for OA, sooner prn

## 2017-04-04 NOTE — Patient Instructions (Addendum)
A prescription for Voltaren gel 1% was sent to your pharmacy.  It is ok to gargle your throat with Warm salt water or chloraseptic spray. Viral Respiratory Infection A viral respiratory infection is an illness that affects parts of the body used for breathing, like the lungs, nose, and throat. It is caused by a germ called a virus. Some examples of this kind of infection are:  A cold.  The flu (influenza).  A respiratory syncytial virus (RSV) infection.  How do I know if I have this infection? Most of the time this infection causes:  A stuffy or runny nose.  Yellow or green fluid in the nose.  A cough.  Sneezing.  Tiredness (fatigue).  Achy muscles.  A sore throat.  Sweating or chills.  A fever.  A headache.  How is this infection treated? If the flu is diagnosed early, it may be treated with an antiviral medicine. This medicine shortens the length of time a person has symptoms. Symptoms may be treated with over-the-counter and prescription medicines, such as:  Expectorants. These make it easier to cough up mucus.  Decongestant nasal sprays.  Doctors do not prescribe antibiotic medicines for viral infections. They do not work with this kind of infection. How do I know if I should stay home? To keep others from getting sick, stay home if you have:  A fever.  A lasting cough.  A sore throat.  A runny nose.  Sneezing.  Muscles aches.  Headaches.  Tiredness.  Weakness.  Chills.  Sweating.  An upset stomach (nausea).  Follow these instructions at home:  Rest as much as possible.  Take over-the-counter and prescription medicines only as told by your doctor.  Drink enough fluid to keep your pee (urine) clear or pale yellow.  Gargle with salt water. Do this 3-4 times per day or as needed. To make a salt-water mixture, dissolve -1 tsp of salt in 1 cup of warm water. Make sure the salt dissolves all the way.  Use nose drops made from salt water.  This helps with stuffiness (congestion). It also helps soften the skin around your nose.  Do not drink alcohol.  Do not use tobacco products, including cigarettes, chewing tobacco, and e-cigarettes. If you need help quitting, ask your doctor. Get help if:  Your symptoms last for 10 days or longer.  Your symptoms get worse over time.  You have a fever.  You have very bad pain in your face or forehead.  Parts of your jaw or neck become very swollen. Get help right away if:  You feel pain or pressure in your chest.  You have shortness of breath.  You faint or feel like you will faint.  You keep throwing up (vomiting).  You feel confused. This information is not intended to replace advice given to you by your health care provider. Make sure you discuss any questions you have with your health care provider. Document Released: 04/04/2008 Document Revised: 09/28/2015 Document Reviewed: 09/28/2014 Elsevier Interactive Patient Education  2018 [employer]  What You Need to Know About Osteoarthritis Osteoarthritis is a type of arthritis that affects tissue that covers the ends of bones in joints (cartilage). Cartilage acts as a cushion between the bones and helps them move smoothly. Osteoarthritis results when cartilage in the joints gets worn down. Osteoarthritis is sometimes called "wear and tear" arthritis. Osteoarthritis can affect any joint and can make movement painful. Hips, knees, fingers, and toes are some of the joints that are  most often affected by osteoarthritis. You may be more likely to develop osteoarthritis if:  You are middle-aged or older.  You are obese.  You have injured a joint or had surgery on a joint.  You have a family history of osteoarthritis.  How can osteoarthritis affect me? Osteoarthritis can cause:  Pain and swelling in your joint.  Difficulty moving your joint.  A grating or scraping feeling inside the joint when you move it.  Popping or  creaking sounds when you move.  This condition can make it harder to do things that you need or want to do each day. Osteoarthritis in a major joint, such as your knee or hip, can make it painful to walk or exercise. If you have osteoarthritis in your hands, you might not be able to grip items, twist your hand, or control small movements of your hands and fingers (fine motor skills). Over time, osteoarthritis could cause you to be less physically active. Being less active increases your risk for other long-term (chronic) health problems, such as diabetes and heart disease. What lifestyle changes can be made? You can lessen the impact that osteoarthritis has on your daily life by:  Switching to low-impact activities that do not put repeated pressure on your joints. For example, if you usually run or jog for exercise, try swimming or riding a bike instead.  Staying active. Build up to at least 150 minutes of physical activity each week to keep your joints healthy and keep your body strong.  Losing weight. If you are overweight or obese, losing weight can take pressure off of your joints. If you need help with weight loss, talk with your health care provider or a diet and nutrition specialist (dietitian).  What other changes can be made? You can also lessen the effect of osteoarthritis by:  Using assistive devices. Sometimes a brace, wrap, splint, specialized glove, or cane can help. Talk with your health care provider or physical therapist about when and how to use these.  Working with a physical therapist who can help you find ways to do your daily activities without harming your joints. A physical therapist can also teach you exercises and stretches to strengthen the muscles that support your joints.  Treating pain and inflammation. Take over-the-counter and prescription medicines for pain and inflammation only as told by your health care provider. If directed, you may put ice on the affected  joint: ? If you have a removable assistive device, remove it as told by your health care provider. ? Put ice in a plastic bag. ? Place a towel between your skin and the bag. ? Leave the ice on for 20 minutes, 2-3 times a day.  If other measures do not work, you may need joint surgery, such as joint replacement. What can happen if changes are not made? Osteoarthritis is a condition that gets worse over time (a progressive condition). If you do not take steps to strengthen your body and to slow down the progress of the disease, your condition may get worse more quickly. Your joints may stiffen and become swollen, which will make them painful and hard to move. Where to find more information: You can learn more about osteoarthritis from:  The Arthritis Foundation: www.http://www.ingram.com/arthritis.org/about-arthritis/types/osteoarthritis  [employer] Arthritis and Musculoskeletal and Skin Diseases: www.niams.https://www.ruiz-smith.com/nih.gov/health_info/osteoarthritis/osteoarthritis_ff.asp  Contact a health care provider if:  You cannot do your normal activities comfortably.  Your joint does not function at all.  Your pain is interfering with your sleep.  You are gaining weight.  Your joint appears to be changing in shape, instead of just being swollen and sore. Summary  Osteoarthritis is a painful joint disease that gets worse over time.  This condition can lead to other long-term (chronic) health problems.  There are changes that you can make to slow down the progression of the disease. This information is not intended to replace advice given to you by your health care provider. Make sure you discuss any questions you have with your health care provider. Document Released: 12/12/2015 Document Revised: 12/14/2015 Document Reviewed: 12/12/2015 Elsevier Interactive Patient Education  2018 [employer].

## 2017-04-07 ENCOUNTER — Telehealth: Payer: Self-pay | Admitting: Family Medicine

## 2017-04-07 NOTE — Telephone Encounter (Signed)
Left message to call back  

## 2017-04-07 NOTE — Telephone Encounter (Signed)
Copied from CRM #[id number]. Topic: Quick Communication - See Telephone Encounter >> Apr 07, 2017  8:11 AM Oneal GroutSebastian, Jennifer S wrote: CRM for notification. See Telephone encounter for: was told to call back if she is not feeling any better, she is not. Congestion in chest.   04/07/17.

## 2017-04-09 NOTE — Telephone Encounter (Signed)
Left a VM for patient to give the office a call back.  

## 2017-04-22 ENCOUNTER — Ambulatory Visit (INDEPENDENT_AMBULATORY_CARE_PROVIDER_SITE_OTHER)
Admission: RE | Admit: 2017-04-22 | Discharge: 2017-04-22 | Disposition: A | Discharge status: Home / Self Care | Payer: Self-pay | Source: Ambulatory Visit | Attending: Family Medicine | Admitting: Family Medicine

## 2017-04-22 ENCOUNTER — Ambulatory Visit (INDEPENDENT_AMBULATORY_CARE_PROVIDER_SITE_OTHER): Payer: PRIVATE HEALTH INSURANCE | Admitting: Family Medicine

## 2017-04-22 ENCOUNTER — Encounter: Payer: Self-pay | Admitting: Emergency Medicine

## 2017-04-22 ENCOUNTER — Encounter: Payer: Self-pay | Admitting: Family Medicine

## 2017-04-22 VITALS — BP 142/90 | HR 100 | Temp 98.4°F

## 2017-04-22 DIAGNOSIS — J0100 Acute maxillary sinusitis, unspecified: Secondary | ICD-10-CM

## 2017-04-22 DIAGNOSIS — R0989 Other specified symptoms and signs involving the circulatory and respiratory systems: Secondary | ICD-10-CM

## 2017-04-22 MED ORDER — AZITHROMYCIN 250 MG PO TABS: ORAL_TABLET | ORAL | 0 refills | Status: DC

## 2017-04-22 NOTE — Patient Instructions (Addendum)

## 2017-04-22 NOTE — Progress Notes (Signed)
Subjective:    Patient ID: Paula Hardy, female    DOB: [birth year], 66 y.o.   MRN: [id number]  No chief complaint on file.   HPI Patient was seen today for acute concern.  Patient recently [place]  Patient had a cold prior to going on the cruise.  Patient symptoms continued, she now endorses thick nasal drainage, facial pressure/pain, congestion.  Patient also has a raspy sounding voice.  Past Medical History:  Diagnosis Date  . Arthritis     No Known Allergies  ROS General: Denies fever, chills, night sweats, changes in weight, changes in appetite HEENT: Denies headaches, ear pain, changes in vision, rhinorrhea, sore throat  +facial pressure/pain, nasal congestion/drainage CV: Denies CP, palpitations, SOB, orthopnea Pulm: Denies SOB, cough, wheezing   +SOB GI: Denies abdominal pain, nausea, vomiting, diarrhea, constipation GU: Denies dysuria, hematuria, frequency, vaginal discharge Msk: Denies muscle cramps, joint pains Neuro: Denies weakness, numbness, tingling Skin: Denies rashes, bruising Psych: Denies depression, anxiety, hallucinations     Objective:    Blood pressure (!) 142/90, pulse 100, temperature 98.4 F (36.9 C), temperature source Oral, SpO2 96 %.   Gen. Pleasant, well-nourished, in no distress, appears sick but non toxic, normal affect,   HEENT: [place]/AT, face symmetric, no scleral icterus, PERRLA, nares patent with erythema and drainage, pharynx with mild erythema, no exudate.  TMs normal, but partially occluded by cerumen in the canal  Lungs: Raspy sounding mouth breathing, no accessory muscle use, faint basilar wheeze, though hard to auscultate 2/2 patient's breathing. Cardiovascular: mild tachycardia, no m/r/g, no peripheral edema Neuro:  A&Ox3, CN II-XII intact, normal gait Skin:  Warm, no lesions/ rash   Wt Readings from Last 3 Encounters:  04/04/17 190 lb 12.8 oz (86.5 kg)  03/13/17 187 lb 6 oz (85 kg)  03/07/17  190 lb (86.2 kg)    Lab Results  Component Value Date   WBC 7.9 03/07/2017   HGB 12.9 03/07/2017   HCT 38.3 03/07/2017   PLT 319 03/07/2017   GLUCOSE 89 02/13/2017   CHOL 280 (H) 02/13/2017   TRIG 113.0 02/13/2017   HDL 79.30 02/13/2017   LDLCALC 179 (H) 02/13/2017   ALT 18 02/13/2017   AST 22 02/13/2017   NA 141 02/13/2017   K 4.6 02/13/2017   CL 103 02/13/2017   CREATININE 0.87 02/13/2017   BUN 15 02/13/2017   CO2 29 02/13/2017   TSH 1.40 02/13/2017    Assessment/Plan:  Acute non-recurrent maxillary sinusitis  -continue supportive care - Plan: azithromycin (ZITHROMAX) 250 MG tablet  Chest congestion  -Will obtain CXR to r/o infectious cause -Given RTC or ED precautions. -Plan: DG Chest 2 View, azithromycin (ZITHROMAX) 250 MG tablet  F/u in the next few days if symptoms become worse or do not improve

## 2017-05-19 ENCOUNTER — Ambulatory Visit: Payer: PRIVATE HEALTH INSURANCE | Admitting: Family Medicine

## 2017-05-22 ENCOUNTER — Encounter: Payer: Self-pay | Admitting: Family Medicine

## 2017-05-22 ENCOUNTER — Ambulatory Visit: Payer: BLUE CROSS/BLUE SHIELD | Admitting: Family Medicine

## 2017-05-22 VITALS — BP 140/70 | HR 83 | Temp 98.2°F | Wt 185.0 lb

## 2017-05-22 DIAGNOSIS — G2581 Restless legs syndrome: Secondary | ICD-10-CM

## 2017-05-22 DIAGNOSIS — M1711 Unilateral primary osteoarthritis, right knee: Secondary | ICD-10-CM | POA: Diagnosis not present

## 2017-05-22 DIAGNOSIS — I83891 Varicose veins of right lower extremities with other complications: Secondary | ICD-10-CM

## 2017-05-22 MED ORDER — DICLOFENAC SODIUM 1 % TD GEL: 2.0000 g | Freq: Three times a day (TID) | TRANSDERMAL | 3 refills | Status: AC | PRN

## 2017-05-22 MED ORDER — CYCLOBENZAPRINE HCL 5 MG PO TABS: 5.0000 mg | ORAL_TABLET | Freq: Every evening | ORAL | 1 refills | Status: DC | PRN

## 2017-05-22 NOTE — Patient Instructions (Addendum)
Osteoarthritis Osteoarthritis is a type of arthritis that affects tissue that covers the ends of bones in joints (cartilage). Cartilage acts as a cushion between the bones and helps them move smoothly. Osteoarthritis results when cartilage in the joints gets worn down. Osteoarthritis is sometimes called "wear and tear" arthritis. Osteoarthritis is the most common form of arthritis. It often occurs in older people. It is a condition that gets worse over time (a progressive condition). Joints that are most often affected by this condition are in:  Fingers.  Toes.  Hips.  Knees.  Spine, including neck and lower back.  What are the causes? This condition is caused by age-related wearing down of cartilage that covers the ends of bones. What increases the risk? The following factors may make you more likely to develop this condition:  Older age.  Being overweight or obese.  Overuse of joints, such as in athletes.  Past injury of a joint.  Past surgery on a joint.  Family history of osteoarthritis.  What are the signs or symptoms? The main symptoms of this condition are pain, swelling, and stiffness in the joint. The joint may lose its shape over time. Small pieces of bone or cartilage may break off and float inside of the joint, which may cause more pain and damage to the joint. Small deposits of bone (osteophytes) may grow on the edges of the joint. Other symptoms may include:  A grating or scraping feeling inside the joint when you move it.  Popping or creaking sounds when you move.  Symptoms may affect one or more joints. Osteoarthritis in a major joint, such as your knee or hip, can make it painful to walk or exercise. If you have osteoarthritis in your hands, you might not be able to grip items, twist your hand, or control small movements of your hands and fingers (fine motor skills). How is this diagnosed? This condition may be diagnosed based on:  Your medical history.  A  physical exam.  Your symptoms.  X-rays of the affected joint(s).  Blood tests to rule out other types of arthritis.  How is this treated? There is no cure for this condition, but treatment can help to control pain and improve joint function. Treatment plans may include:  A prescribed exercise program that allows for rest and joint relief. You may work with a physical therapist.  A weight control plan.  Pain relief techniques, such as: ? Applying heat and cold to the joint. ? Electric pulses delivered to nerve endings under the skin (transcutaneous electrical nerve stimulation, or TENS). ? Massage. ? Certain nutritional supplements.  NSAIDs or prescription medicines to help relieve pain.  Medicine to help relieve pain and inflammation (corticosteroids). This can be given by mouth (orally) or as an injection.  Assistive devices, such as a brace, wrap, splint, specialized glove, or cane.  Surgery, such as: ? An osteotomy. This is done to reposition the bones and relieve pain or to remove loose pieces of bone and cartilage. ? Joint replacement surgery. You may need this surgery if you have very bad (advanced) osteoarthritis.  Follow these instructions at home: Activity  Rest your affected joints as directed by your health care provider.  Do not drive or use heavy machinery while taking prescription pain medicine.  Exercise as directed. Your health care provider or physical therapist may recommend specific types of exercise, such as: ? Strengthening exercises. These are done to strengthen the muscles that support joints that are affected by arthritis.   They can be performed with weights or with exercise bands to add resistance. ? Aerobic activities. These are exercises, such as brisk walking or water aerobics, that get your heart pumping. ? Range-of-motion activities. These keep your joints easy to move. ? Balance and agility exercises. Managing pain, stiffness, and  swelling  If directed, apply heat to the affected area as often as told by your health care provider. Use the heat source that your health care provider recommends, such as a moist heat pack or a heating pad. ? If you have a removable assistive device, remove it as told by your health care provider. ? Place a towel between your skin and the heat source. If your health care provider tells you to keep the assistive device on while you apply heat, place a towel between the assistive device and the heat source. ? Leave the heat on for 20-30 minutes. ? Remove the heat if your skin turns bright red. This is especially important if you are unable to feel pain, heat, or cold. You may have a greater risk of getting burned.  If directed, put ice on the affected joint: ? If you have a removable assistive device, remove it as told by your health care provider. ? Put ice in a plastic bag. ? Place a towel between your skin and the bag. If your health care provider tells you to keep the assistive device on during icing, place a towel between the assistive device and the bag. ? Leave the ice on for 20 minutes, 2-3 times a day. General instructions  Take over-the-counter and prescription medicines only as told by your health care provider.  Maintain a healthy weight. Follow instructions from your health care provider for weight control. These may include dietary restrictions.  Do not use any products that contain nicotine or tobacco, such as cigarettes and e-cigarettes. These can delay bone healing. If you need help quitting, ask your health care provider.  Use assistive devices as directed by your health care provider.  Keep all follow-up visits as told by your health care provider. This is important. Where to find more information:  [employer] of Arthritis and Musculoskeletal and Skin Diseases: www.niams.http://www.myers.net/  [employer] on Aging: https://walker.com/  American College of Rheumatology:  www.rheumatology.org Contact a health care provider if:  Your skin turns red.  You develop a rash.  You have pain that gets worse.  You have a fever along with joint or muscle aches. Get help right away if:  You lose a lot of weight.  You suddenly lose your appetite.  You have night sweats. Summary  Osteoarthritis is a type of arthritis that affects tissue covering the ends of bones in joints (cartilage).  This condition is caused by age-related wearing down of cartilage that covers the ends of bones.  The main symptom of this condition is pain, swelling, and stiffness in the joint.  There is no cure for this condition, but treatment can help to control pain and improve joint function. This information is not intended to replace advice given to you by your health care provider. Make sure you discuss any questions you have with your health care provider. Document Released: 04/22/2005 Document Revised: 12/25/2015 Document Reviewed: 12/25/2015 Elsevier Interactive Patient Education  2018 Elsevier Inc. Restless Legs Syndrome Restless legs syndrome is a condition that causes uncomfortable feelings or sensations in the legs, especially while sitting or lying down. The sensations usually cause an overwhelming urge to move the legs. The arms can  also sometimes be affected. The condition can range from mild to severe. The symptoms often interfere with a person's ability to sleep. What are the causes? The cause of this condition is not known. What increases the risk? This condition is more likely to develop in:  People who are older than age 54.  Pregnant women. In general, restless legs syndrome is more common in women than in men.  People who have a family history of the condition.  People who have certain medical conditions, such as iron deficiency, kidney disease, Parkinson disease, or nerve damage.  People who take certain medicines, such as medicines for high blood pressure,  nausea, colds, allergies, depression, and some heart conditions.  What are the signs or symptoms? The main symptom of this condition is uncomfortable sensations in the legs. These sensations may be:  Described as pulling, tingling, prickling, throbbing, crawling, or burning.  Worse while you are sitting or lying down.  Worse during periods of rest or inactivity.  Worse at night, often interfering with your sleep.  Accompanied by a very strong urge to move your legs.  Temporarily relieved by movement of your legs.  The sensations usually affect both sides of the body. The arms can also be affected, but this is rare. People who have this condition often have tiredness during the day because of their lack of sleep at night. How is this diagnosed? This condition may be diagnosed based on your description of the symptoms. You may also have tests, including blood tests, to check for other conditions that may lead to your symptoms. In some cases, you may be asked to spend some time in a sleep lab so your sleeping can be monitored. How is this treated? Treatment for this condition is focused on managing the symptoms. Treatment may include:  Self-help and lifestyle changes.  Medicines.  Follow these instructions at home:  Take medicines only as directed by your health care provider.  Try these methods to get temporary relief from the uncomfortable sensations: ? Massage your legs. ? Walk or stretch. ? Take a cold or hot bath.  Practice good sleep habits. For example, go to bed and get up at the same time every day.  Exercise regularly.  Practice ways of relaxing, such as yoga or meditation.  Avoid caffeine and alcohol.  Do not use any tobacco products, including cigarettes, chewing tobacco, or electronic cigarettes. If you need help quitting, ask your health care provider.  Keep all follow-up visits as directed by your health care provider. This is important. Contact a health  care provider if: Your symptoms do not improve with treatment, or they get worse. This information is not intended to replace advice given to you by your health care provider. Make sure you discuss any questions you have with your health care provider. Document Released: 04/12/2002 Document Revised: 09/28/2015 Document Reviewed: 04/18/2014 Elsevier Interactive Patient Education  2018 Elsevier Inc.  Varicose Veins Varicose veins are veins that have become enlarged and twisted. They are usually seen in the legs but can occur in other parts of the body as well. What are the causes? This condition is the result of valves in the veins not working properly. Valves in the veins help to return blood from the leg to the heart. If these valves are damaged, blood flows backward and backs up into the veins in the leg near the skin. This causes the veins to become larger. What increases the risk? People who are on their feet  a lot, who are pregnant, or who are overweight are more likely to develop varicose veins. What are the signs or symptoms?  Bulging, twisted-appearing, bluish veins, most commonly found on the legs.  Leg pain or a feeling of heaviness. These symptoms may be worse at the end of the day.  Leg swelling.  Changes in skin color. How is this diagnosed? A health care provider can usually diagnose varicose veins by examining your legs. Your health care provider may also recommend an ultrasound of your leg veins. How is this treated? Most varicose veins can be treated at home.However, other treatments are available for people who have persistent symptoms or want to improve the cosmetic appearance of the varicose veins. These treatment options include:  Sclerotherapy. A solution is injected into the vein to close it off.  Laser treatment. A laser is used to heat the vein to close it off.  Radiofrequency vein ablation. An electrical current produced by radio waves is used to close off the  vein.  Phlebectomy. The vein is surgically removed through small incisions made over the varicose vein.  Vein ligation and stripping. The vein is surgically removed through incisions made over the varicose vein after the vein has been tied (ligated).  Follow these instructions at home:  Do not stand or sit in one position for long periods of time. Do not sit with your legs crossed. Rest with your legs raised during the day.  Wear compression stockings as directed by your health care provider. These stockings help to prevent blood clots and reduce swelling in your legs.  Do not wear other tight, encircling garments around your legs, pelvis, or waist.  Walk as much as possible to increase blood flow.  Raise the foot of your bed at night with 2-inch blocks.  If you get a cut in the skin over the vein and the vein bleeds, lie down with your leg raised and press on it with a clean cloth until the bleeding stops. Then place a bandage (dressing) on the cut. See your health care provider if it continues to bleed. Contact a health care provider if:  The skin around your ankle starts to break down.  You have pain, redness, tenderness, or hard swelling in your leg over a vein.  You are uncomfortable because of leg pain. This information is not intended to replace advice given to you by your health care provider. Make sure you discuss any questions you have with your health care provider. Document Released: 01/30/2005 Document Revised: 09/28/2015 Document Reviewed: 10/24/2015 Elsevier Interactive Patient Education  2017 [employer]

## 2017-05-22 NOTE — Progress Notes (Signed)
Subjective:    Patient ID: Paula Hardy, female    DOB: 09-04-1950, 67 y.o.   MRN: [id number]  Chief Complaint  Patient presents with  . Leg Pain    RL pain     HPI Patient was seen today for f/u and acute concerns.  Pt states the voltaren gel is helping with her knee pain.  Pt states she went to the pharmacy to get a refill but b/c she changed insurances they would not give it to her.  Pt was advised to speak with her provider.  Pt endorses having difficulty sleeping 2/2 a feeling of needing to move her legs/ pain at night.  This has been happening for over a wk.  She has not tried anything for this and has never had this happen before.  Pt also mentions her varicose veins are becoming painful.  Pt states they throb.  Pt endorses standing on her feet at work a lot.  Pt has not tried anything for this.  Past Medical History:  Diagnosis Date  . Arthritis     No Known Allergies  ROS General: Denies fever, chills, night sweats, changes in weight, changes in appetite HEENT: Denies headaches, ear pain, changes in vision, rhinorrhea, sore throat CV: Denies CP, palpitations, SOB, orthopnea Pulm: Denies SOB, cough, wheezing GI: Denies abdominal pain, nausea, vomiting, diarrhea, constipation GU: Denies dysuria, hematuria, frequency, vaginal discharge Msk: Denies muscle cramps, joint pains  +restless legs, R knee pain Neuro: Denies weakness, numbness, tingling Skin: Denies rashes, bruising  +varicose veins in R leg Psych: Denies depression, anxiety, hallucinations     Objective:    Blood pressure 140/70, pulse 83, temperature 98.2 F (36.8 C), temperature source Oral, weight 185 lb (83.9 kg).   Gen. Pleasant, well-nourished, in no distress, normal affect HEENT: [place]/AT, face symmetric, no scleral icterus, PERRLA, nares patent without drainage Lungs: no accessory muscle use, CTAB, no wheezes or rales Cardiovascular: RRR, no m/r/g, no peripheral edema Musculoskeletal: R knee >L  knee.  Crepitus in b/l knees.  No effusion noted in R knee.  TTP of lateral joint line and popliteal area of R knee.  Varicose veins of R  Lateral knee and lower thigh.  No cyanosis or clubbing, normal tone Neuro:  A&Ox3, CN II-XII intact, normal gait Skin:  Warm, no lesions/ rash   Wt Readings from Last 3 Encounters:  05/22/17 185 lb (83.9 kg)  04/04/17 190 lb 12.8 oz (86.5 kg)  03/13/17 187 lb 6 oz (85 kg)    Lab Results  Component Value Date   WBC 7.9 03/07/2017   HGB 12.9 03/07/2017   HCT 38.3 03/07/2017   PLT 319 03/07/2017   GLUCOSE 89 02/13/2017   CHOL 280 (H) 02/13/2017   TRIG 113.0 02/13/2017   HDL 79.30 02/13/2017   LDLCALC 179 (H) 02/13/2017   ALT 18 02/13/2017   AST 22 02/13/2017   NA 141 02/13/2017   K 4.6 02/13/2017   CL 103 02/13/2017   CREATININE 0.87 02/13/2017   BUN 15 02/13/2017   CO2 29 02/13/2017   TSH 1.40 02/13/2017    Assessment/Plan:  Varicose veins of leg with swelling, right  -given painful nature of veins will refer to vascular for treatment options. -discussed wearing supportive shoes and compression stockings or socks. - Plan: Ambulatory referral to Vascular Surgery  Primary osteoarthritis of right knee  -Xray R knee 02/14/17 with Tricompartmental degenerative change. -will resend rx.  May need PA, as pt changed insurance companies recently. -  Plan: diclofenac sodium (VOLTAREN) 1 % GEL  Restless leg syndrome  -given handout -discussed trying a spoonful of yellow mustard prior to bed relief of symptoms - Plan: cyclobenzaprine (FLEXERIL) 5 MG tablet prn   F/u in 1 month, sooner if needed.  Abbe Amsterdam, MD

## 2017-05-29 ENCOUNTER — Telehealth: Payer: Self-pay | Admitting: Family Medicine

## 2017-05-29 NOTE — Telephone Encounter (Signed)
Copied from CRM [phone]. Topic: Quick Communication - See Telephone Encounter >> May 29, 2017  2:19 PM Landry MellowFoltz, Melissa J wrote: CRM for notification. See Telephone encounter for:   05/29/17. Pt needs to have [employer] auth on voltaren gel - pt went to pharmacy and they told her they were waiting on pa to be completed. Please call pt back at [phone].  [facility] [address]

## 2017-05-30 NOTE — Telephone Encounter (Signed)
Prior auth sent to [id number].

## 2017-06-03 ENCOUNTER — Telehealth: Payer: Self-pay | Admitting: Family Medicine

## 2017-06-03 NOTE — Telephone Encounter (Signed)
Copied from CRM [id number]. Topic: Inquiry >> Jun 03, 2017 12:30 PM Windy KalataMichael, Audi Wettstein L, NT wrote: [facility]ony [facility] and is needing further clinical information. Please contact Alinda Moneyony.   Cb# [phone] Fax # [phone]

## 2017-06-03 NOTE — Telephone Encounter (Signed)
See 05/29/17 phone note regarding PA.

## 2017-06-04 NOTE — Telephone Encounter (Signed)
[employer] is calling again, requesting call back, need further clinical info, Call Back [phone] Ext [phone], Marena ChancyBianca

## 2017-06-05 ENCOUNTER — Ambulatory Visit: Payer: BLUE CROSS/BLUE SHIELD | Admitting: Family Medicine

## 2017-06-06 ENCOUNTER — Encounter: Payer: Self-pay | Admitting: Family Medicine

## 2017-06-06 ENCOUNTER — Ambulatory Visit: Payer: BLUE CROSS/BLUE SHIELD | Admitting: Family Medicine

## 2017-06-06 VITALS — BP 140/80 | HR 85 | Temp 98.4°F | Wt 187.6 lb

## 2017-06-06 DIAGNOSIS — J069 Acute upper respiratory infection, unspecified: Secondary | ICD-10-CM

## 2017-06-06 DIAGNOSIS — B9789 Other viral agents as the cause of diseases classified elsewhere: Secondary | ICD-10-CM | POA: Diagnosis not present

## 2017-06-06 MED ORDER — PREDNISONE 10 MG PO TABS: ORAL_TABLET | ORAL | 0 refills | Status: DC

## 2017-06-06 MED ORDER — AMOXICILLIN 500 MG PO CAPS: 500.0000 mg | ORAL_CAPSULE | Freq: Two times a day (BID) | ORAL | 0 refills | Status: AC

## 2017-06-06 NOTE — Telephone Encounter (Signed)
See prior note-[facility]Bianca [facility] the request was sent to a physician for review and fax will be sent to our office with the decision.

## 2017-06-06 NOTE — Patient Instructions (Addendum)
Upper Respiratory Infection, Adult Most upper respiratory infections (URIs) are a viral infection of the air passages leading to the lungs. A URI affects the nose, throat, and upper air passages. The most common type of URI is nasopharyngitis and is typically referred to as "the common cold." URIs run their course and usually go away on their own. Most of the time, a URI does not require medical attention, but sometimes a bacterial infection in the upper airways can follow a viral infection. This is called a secondary infection. Sinus and middle ear infections are common types of secondary upper respiratory infections. Bacterial pneumonia can also complicate a URI. A URI can worsen asthma and chronic obstructive pulmonary disease (COPD). Sometimes, these complications can require emergency medical care and may be life threatening. What are the causes? Almost all URIs are caused by viruses. A virus is a type of germ and can spread from one person to another. What increases the risk? You may be at risk for a URI if:  You smoke.  You have chronic heart or lung disease.  You have a weakened defense (immune) system.  You are very young or very old.  You have nasal allergies or asthma.  You work in crowded or poorly ventilated areas.  You work in health care facilities or schools.  What are the signs or symptoms? Symptoms typically develop 2-3 days after you come in contact with a cold virus. Most viral URIs last 7-10 days. However, viral URIs from the influenza virus (flu virus) can last 14-18 days and are typically more severe. Symptoms may include:  Runny or stuffy (congested) nose.  Sneezing.  Cough.  Sore throat.  Headache.  Fatigue.  Fever.  Loss of appetite.  Pain in your forehead, behind your eyes, and over your cheekbones (sinus pain).  Muscle aches.  How is this diagnosed? Your health care provider may diagnose a URI by:  Physical exam.  Tests to check that your  symptoms are not due to another condition such as: ? Strep throat. ? Sinusitis. ? Pneumonia. ? Asthma.  How is this treated? A URI goes away on its own with time. It cannot be cured with medicines, but medicines may be prescribed or recommended to relieve symptoms. Medicines may help:  Reduce your fever.  Reduce your cough.  Relieve nasal congestion.  Follow these instructions at home:  Take medicines only as directed by your health care provider.  Gargle warm saltwater or take cough drops to comfort your throat as directed by your health care provider.  Use a warm mist humidifier or inhale steam from a shower to increase air moisture. This may make it easier to breathe.  Drink enough fluid to keep your urine clear or pale yellow.  Eat soups and other clear broths and maintain good nutrition.  Rest as needed.  Return to work when your temperature has returned to normal or as your health care provider advises. You may need to stay home longer to avoid infecting others. You can also use a face mask and careful hand washing to prevent spread of the virus.  Increase the usage of your inhaler if you have asthma.  Do not use any tobacco products, including cigarettes, chewing tobacco, or electronic cigarettes. If you need help quitting, ask your health care provider. How is this prevented? The best way to protect yourself from getting a cold is to practice good hygiene.  Avoid oral or hand contact with people with cold symptoms.  Wash your   hands often if contact occurs.  There is no clear evidence that vitamin C, vitamin E, echinacea, or exercise reduces the chance of developing a cold. However, it is always recommended to get plenty of rest, exercise, and practice good nutrition. Contact a health care provider if:  You are getting worse rather than better.  Your symptoms are not controlled by medicine.  You have chills.  You have worsening shortness of breath.  You have  brown or red mucus.  You have yellow or brown nasal discharge.  You have pain in your face, especially when you bend forward.  You have a fever.  You have swollen neck glands.  You have pain while swallowing.  You have white areas in the back of your throat. Get help right away if:  You have severe or persistent: ? Headache. ? Ear pain. ? Sinus pain. ? Chest pain.  You have chronic lung disease and any of the following: ? Wheezing. ? Prolonged cough. ? Coughing up blood. ? A change in your usual mucus.  You have a stiff neck.  You have changes in your: ? Vision. ? Hearing. ? Thinking. ? Mood. This information is not intended to replace advice given to you by your health care provider. Make sure you discuss any questions you have with your health care provider. Document Released: 10/16/2000 Document Revised: 12/24/2015 Document Reviewed: 07/28/2013 Elsevier Interactive Patient Education  2018 Elsevier Inc.  

## 2017-06-06 NOTE — Telephone Encounter (Signed)
I called [employer] and spoke with Marena ChancyBianca and she stated the claim is under review by the physician and a fax will be sent to our office. Dr Salomon FickBanks informed the pt of this at her appt today.

## 2017-06-06 NOTE — Progress Notes (Signed)
Subjective:    Patient ID: Paula Hardy, female    DOB: [birth year], 67 y.o.   MRN: [id number]  Chief Complaint  Patient presents with  . Sinusitis    started tuesday  . Nasal Congestion  . Cough    HPI Patient was seen today for acute concern.  Patient endorses sinus pressure, nasal congestion, productive cough that started on Wednesday.  Patient denies fever, chills, nausea, vomiting.  Patient states she has taken Mucinex with little relief.  Patient endorses sick contacts at work.  Patient is concerned she has sinus infection.  Patient states she was planning to [place] [facility] for a [place] Bowl party at her daughter's.  Past Medical History:  Diagnosis Date  . Arthritis     No Known Allergies  ROS General: Denies fever, chills, night sweats, changes in weight, changes in appetite HEENT: Denies headaches, ear pain, changes in vision, rhinorrhea, sore throat  + nasal congestion, sinus pressure. CV: Denies CP, palpitations, SOB, orthopnea Pulm: Denies SOB, wheezing  +cough GI: Denies abdominal pain, nausea, vomiting, diarrhea, constipation GU: Denies dysuria, hematuria, frequency, vaginal discharge Msk: Denies muscle cramps, joint pains Neuro: Denies weakness, numbness, tingling Skin: Denies rashes, bruising Psych: Denies depression, anxiety, hallucinations     Objective:    Blood pressure 140/80, pulse 85, temperature 98.4 F (36.9 C), temperature source Oral, weight 187 lb 9.6 oz (85.1 kg), SpO2 96 %.   Gen. Pleasant, well-nourished, in no distress, normal affect   HEENT: [place]/AT, face symmetric, no scleral icterus, PERRLA, nares patent with clear drainage, pharynx with postnasal drainage with mild erythema or exudate. TMs nml b/l. Lungs: no accessory muscle use, CTAB, no wheezes or rales Cardiovascular: RRR, no m/r/g, no peripheral edema Abdomen: BS present, soft, NT/ND Neuro:  A&Ox3, CN II-XII intact, normal gait    Wt Readings from Last 3 Encounters:   06/06/17 187 lb 9.6 oz (85.1 kg)  05/22/17 185 lb (83.9 kg)  04/04/17 190 lb 12.8 oz (86.5 kg)    Lab Results  Component Value Date   WBC 7.9 03/07/2017   HGB 12.9 03/07/2017   HCT 38.3 03/07/2017   PLT 319 03/07/2017   GLUCOSE 89 02/13/2017   CHOL 280 (H) 02/13/2017   TRIG 113.0 02/13/2017   HDL 79.30 02/13/2017   LDLCALC 179 (H) 02/13/2017   ALT 18 02/13/2017   AST 22 02/13/2017   NA 141 02/13/2017   K 4.6 02/13/2017   CL 103 02/13/2017   CREATININE 0.87 02/13/2017   BUN 15 02/13/2017   CO2 29 02/13/2017   TSH 1.40 02/13/2017    Assessment/Plan:  Viral URI with cough  -supportive care -Plan: predniSONE (DELTASONE) 10 MG tablet  -f/u prn  Pt given an wait and see rx for amoxicillin as plans to travel out of town.  Pt advised to wait until Monday, if feeling better do not take abx.  Abbe AmsterdamShannon Camden Knotek, MD

## 2017-06-11 ENCOUNTER — Telehealth: Payer: Self-pay | Admitting: Emergency Medicine

## 2017-06-11 NOTE — Telephone Encounter (Signed)
[facility] Es[facility]acist[facility] and gave the dx and further information on why patient uses the Diclofenac gel. Patient is approved for medication and will be able to pick it up from pharmacy today. Also left a VM for patient regarding approval for medication.  Nothing further needed.

## 2017-06-11 NOTE — Telephone Encounter (Signed)
Left a VM for pharmacist with [employer] to give the office a call back regarding an appeal for Diclofenac Gel. Patient was using this medication previously and cannot use the Diclofenac tablet due to the GI bleed that occurred the last time patient tried the tablet.

## 2017-06-18 ENCOUNTER — Telehealth: Payer: Self-pay | Admitting: Emergency Medicine

## 2017-06-18 NOTE — Telephone Encounter (Signed)
Spoke with pharmacist regarding patient medication (Diclofenac gel) being on hold. Pharmacist states that medication has been approved and will be ready for pick up.

## 2017-06-25 ENCOUNTER — Other Ambulatory Visit: Payer: Self-pay | Admitting: Obstetrics and Gynecology

## 2017-06-25 ENCOUNTER — Other Ambulatory Visit (HOSPITAL_COMMUNITY)
Admission: RE | Admit: 2017-06-25 | Discharge: 2017-06-25 | Disposition: A | Discharge status: Home / Self Care | Payer: BLUE CROSS/BLUE SHIELD | Source: Ambulatory Visit | Attending: Obstetrics and Gynecology | Admitting: Obstetrics and Gynecology

## 2017-06-25 DIAGNOSIS — Z124 Encounter for screening for malignant neoplasm of cervix: Secondary | ICD-10-CM | POA: Insufficient documentation

## 2017-07-01 LAB — CYTOLOGY - PAP
Diagnosis: NEGATIVE
HPV: NOT DETECTED

## 2017-07-14 ENCOUNTER — Encounter: Payer: Self-pay | Admitting: Vascular Surgery

## 2017-07-14 ENCOUNTER — Ambulatory Visit: Payer: BLUE CROSS/BLUE SHIELD | Admitting: Vascular Surgery

## 2017-07-14 VITALS — BP 126/79 | HR 92 | Resp 18 | Ht <= 58 in | Wt 178.8 lb

## 2017-07-14 DIAGNOSIS — I83893 Varicose veins of bilateral lower extremities with other complications: Secondary | ICD-10-CM | POA: Insufficient documentation

## 2017-07-14 NOTE — Progress Notes (Signed)
Subjective:     Patient ID: Paula Hardy, female   DOB: [birth year], 67 y.o.   MRN: [id number]  HPI This 67 year old female was  Referred by Dr. Abbe Amsterdam for evaluation of bilateral varicose veins.She had a right hip replacement 8 about 2 years ago. She has been having some aching discomfort in both knees right worse then left. She has noted some small broken veins in the lower thigh area. She does have some swelling in the right calf compared to the left calf. She has no history of DVT, thrombophlebitis, stasis ulcers, or bleeding. She does not wear elastic compression stockings. Past Medical History:  Diagnosis Date  . Arthritis     Social History   Tobacco Use  . Smoking status: Former Smoker    Last attempt to quit: 03/14/1987    Years since quitting: 30.3  . Smokeless tobacco: Never Used  Substance Use Topics  . Alcohol use: Yes    Comment: occasional     Family History  Problem Relation Age of Onset  . Lung cancer Brother   . HIV/AIDS Brother     No Known Allergies   Current Outpatient Medications:  .  aspirin 81 MG chewable tablet, Chew 1 tablet (81 mg total) by mouth 2 (two) times daily., Disp: 60 tablet, Rfl: 1 .  B COMPLEX-C-FOLIC ACID PO, Take 1 tablet by mouth daily., Disp: , Rfl:  .  Biotin w/ Vitamins C & E (HAIR/SKIN/NAILS PO), Take 2,500 mg by mouth daily., Disp: , Rfl:  .  cyclobenzaprine (FLEXERIL) 5 MG tablet, Take 1 tablet (5 mg total) by mouth at bedtime as needed for muscle spasms., Disp: 30 tablet, Rfl: 1 .  Multiple Vitamins-Minerals (MULTIVITAL) CHEW, Chew 1 tablet by mouth daily., Disp: , Rfl:  .  Multiple Vitamins-Minerals-FA (ACTIVITE EC PO), Take 2 tablets by mouth daily., Disp: , Rfl:  .  predniSONE (DELTASONE) 10 MG tablet, Take 5 tabs on day 1, 4 tabs on day 2, 3 tabs on day 3, 2 tabs on day 4, 1 tab on day 5., Disp: 15 tablet, Rfl: 0 .  diclofenac sodium (VOLTAREN) 1 % GEL, Apply 2 g topically 3 (three) times daily as needed. (Patient  not taking: Reported on 06/06/2017), Disp: 100 g, Rfl: 3  Vitals:   07/14/17 1410  BP: 126/79  Pulse: 92  Resp: 18  SpO2: 97%  Weight: 178 lb 12.8 oz (81.1 kg)  Height: 4\' 10"  (1.473 m)    Body mass index is 37.37 kg/m.         Review of Systems Denies chest pain, dyspnea on exertion, PND, orthopnea, hemoptysis. See history of present illness regarding arthritis. Had recent x-ray at rheumatologist office-right knee    Objective:   Physical Exam BP 126/79 (BP Location: Left Arm, Patient Position: Sitting, Cuff Size: Normal)   Pulse 92   Resp 18   Ht 4\' 10"  (1.473 m)   Wt 178 lb 12.8 oz (81.1 kg)   SpO2 97%   BMI 37.37 kg/m     Gen.-alert and oriented x3 in no apparent distress-obese HEENT normal for age Lungs no rhonchi or wheezing Cardiovascular regular rhythm no murmurs carotid pulses 3+ palpable no bruits audible Abdomen soft nontender no palpable masses-Obese Musculoskeletal free of  major deformities Skin clear -no rashes Neurologic normal Lower extremities 3+ femoral and dorsalis pedis pulses palpable bilaterally with no edemaon left, trace edema on right Right calf about 1-2 cm larger in circumference than left. Pattern of reticular  veins and right distal lateral thigh and extending up toward lateral buttock area. No bulging varicosities noted.  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

## 2017-08-04 ENCOUNTER — Other Ambulatory Visit: Payer: Self-pay | Admitting: Family Medicine

## 2017-08-04 DIAGNOSIS — G2581 Restless legs syndrome: Secondary | ICD-10-CM

## 2017-08-13 ENCOUNTER — Encounter: Payer: Self-pay | Admitting: Family Medicine

## 2017-08-13 ENCOUNTER — Ambulatory Visit: Payer: BLUE CROSS/BLUE SHIELD | Admitting: Family Medicine

## 2017-08-13 VITALS — BP 120/80 | HR 95 | Temp 98.2°F | Wt 180.6 lb

## 2017-08-13 DIAGNOSIS — L03115 Cellulitis of right lower limb: Secondary | ICD-10-CM | POA: Diagnosis not present

## 2017-08-13 DIAGNOSIS — G2581 Restless legs syndrome: Secondary | ICD-10-CM | POA: Diagnosis not present

## 2017-08-13 MED ORDER — CYCLOBENZAPRINE HCL 5 MG PO TABS: ORAL_TABLET | ORAL | 1 refills | Status: AC

## 2017-08-13 MED ORDER — SULFAMETHOXAZOLE-TRIMETHOPRIM 800-160 MG PO TABS: 1.0000 | ORAL_TABLET | Freq: Two times a day (BID) | ORAL | 0 refills | Status: AC

## 2017-08-13 NOTE — Patient Instructions (Signed)

## 2017-08-13 NOTE — Progress Notes (Signed)
Subjective:    Patient ID: Paula Hardy, female    DOB: 12-22-1950, 67 y.o.   MRN: [id number]  Chief Complaint  Patient presents with  . Skin Problem    righ hip and thigh     HPI Patient was seen today for acute concern.  Patient endorses right thigh/hip swelling and pain since Tuesday night.  Patient states he noticed the area on her skin after returning from work.  Patient denies any recent trauma to the area.  Patient denies fever.  Patient states she had chills on Sunday night after dinner but did not think anything of it.  Patient does have a history of right hip replacement on 03/21/2016.    Of note patient had colonoscopy done on Friday.    Past Medical History:  Diagnosis Date  . Arthritis     No Known Allergies  ROS General: Denies fever, chills, night sweats, changes in weight, changes in appetite HEENT: Denies headaches, ear pain, changes in vision, rhinorrhea, sore throat CV: Denies CP, palpitations, SOB, orthopnea Pulm: Denies SOB, cough, wheezing GI: Denies abdominal pain, nausea, vomiting, diarrhea, constipation GU: Denies dysuria, hematuria, frequency, vaginal discharge Msk: Denies muscle cramps, joint pains  +R thigh pain Neuro: Denies weakness, numbness, tingling Skin: Denies rashes, bruising  +R hip/thigh edema Psych: Denies depression, anxiety, hallucinations     Objective:    Blood pressure 120/80, pulse 95, temperature 98.2 F (36.8 C), temperature source Oral, weight 180 lb 9.6 oz (81.9 kg), SpO2 98 %.   Gen. Pleasant, well-nourished, in no distress, normal affect   HEENT: Wearing glasses, [place]/AT, face symmetric,no scleral icterus, PERRLA, nares patent without drainage, pharynx without erythema or exudate. Lungs: no accessory muscle use, CTAB, no wheezes or rales Cardiovascular: RRR, no m/r/g, no peripheral edema Musculoskeletal: No deformities, no cyanosis or clubbing, normal tone.  No pain with movement of hip. Neuro:  A&Ox3, CN II-XII  intact, normal gait Skin:  Warm, dry intact.  R hip/lateral thigh and buttock with erythema and increased warmth.  No lesions noted.   Wt Readings from Last 3 Encounters:  08/13/17 180 lb 9.6 oz (81.9 kg)  07/14/17 178 lb 12.8 oz (81.1 kg)  06/06/17 187 lb 9.6 oz (85.1 kg)    Lab Results  Component Value Date   WBC 7.9 03/07/2017   HGB 12.9 03/07/2017   HCT 38.3 03/07/2017   PLT 319 03/07/2017   GLUCOSE 89 02/13/2017   CHOL 280 (H) 02/13/2017   TRIG 113.0 02/13/2017   HDL 79.30 02/13/2017   LDLCALC 179 (H) 02/13/2017   ALT 18 02/13/2017   AST 22 02/13/2017   NA 141 02/13/2017   K 4.6 02/13/2017   CL 103 02/13/2017   CREATININE 0.87 02/13/2017   BUN 15 02/13/2017   CO2 29 02/13/2017   TSH 1.40 02/13/2017    Assessment/Plan:  Cellulitis of right lower extremity  -Discussed prognosis and causes of cellulitis.  More concerned as area of cellulitis is over pt's R hip replacement. -Discussed drinking plenty of water while taking medication -Patient encouraged to return to clinic or emergency department for increased area of erythema/pain, inability to move R hip without pain, fever, chills, nausea, vomiting, weakness, etc. - Plan: sulfamethoxazole-trimethoprim (BACTRIM DS,SEPTRA DS) 800-160 MG tablet  Restless leg syndrome - Plan: cyclobenzaprine (FLEXERIL) 5 MG tablet  Follow-up PRN  Abbe AmsterdamShannon Banks, MD

## 2017-09-09 ENCOUNTER — Other Ambulatory Visit: Payer: Self-pay | Admitting: Family Medicine

## 2017-09-09 DIAGNOSIS — G2581 Restless legs syndrome: Secondary | ICD-10-CM

## 2018-01-20 ENCOUNTER — Emergency Department (HOSPITAL_COMMUNITY)
Admission: EM | Admit: 2018-01-20 | Discharge: 2018-01-20 | Disposition: A | Discharge status: Home / Self Care | Payer: Medicare Other | Attending: Emergency Medicine | Admitting: Emergency Medicine

## 2018-01-20 ENCOUNTER — Other Ambulatory Visit: Payer: Self-pay

## 2018-01-20 ENCOUNTER — Emergency Department (HOSPITAL_COMMUNITY): Payer: Medicare Other

## 2018-01-20 ENCOUNTER — Encounter (HOSPITAL_COMMUNITY): Payer: Self-pay | Admitting: Emergency Medicine

## 2018-01-20 DIAGNOSIS — Z96641 Presence of right artificial hip joint: Secondary | ICD-10-CM | POA: Insufficient documentation

## 2018-01-20 DIAGNOSIS — Z79899 Other long term (current) drug therapy: Secondary | ICD-10-CM | POA: Insufficient documentation

## 2018-01-20 DIAGNOSIS — Z87891 Personal history of nicotine dependence: Secondary | ICD-10-CM | POA: Insufficient documentation

## 2018-01-20 DIAGNOSIS — R0789 Other chest pain: Secondary | ICD-10-CM

## 2018-01-20 LAB — I-STAT TROPONIN, ED
TROPONIN I, POC: 0 ng/mL (ref 0.00–0.08)
Troponin i, poc: 0.01 ng/mL (ref 0.00–0.08)

## 2018-01-20 LAB — BASIC METABOLIC PANEL
Anion gap: 11 (ref 5–15)
BUN: 16 mg/dL (ref 8–23)
CO2: 25 mmol/L (ref 22–32)
CREATININE: 0.78 mg/dL (ref 0.44–1.00)
Calcium: 9.2 mg/dL (ref 8.9–10.3)
Chloride: 103 mmol/L (ref 98–111)
GFR calc Af Amer: >60 mL/min (ref >60)
Glucose, Bld: 96 mg/dL (ref 70–99)
Potassium: 4.2 mmol/L (ref 3.5–5.1)
SODIUM: 139 mmol/L (ref 135–145)

## 2018-01-20 LAB — CBC
HEMATOCRIT: 42.3 % (ref 36.0–46.0)
Hemoglobin: 12.9 g/dL (ref 12.0–15.0)
MCH: 27.7 pg (ref 26.0–34.0)
MCHC: 30.5 g/dL (ref 30.0–36.0)
MCV: 91 fL (ref 78.0–100.0)
Platelets: 302 10*3/uL (ref 150–400)
RBC: 4.65 MIL/uL (ref 3.87–5.11)
RDW: 13.4 % (ref 11.5–15.5)
WBC: 6 10*3/uL (ref 4.0–10.5)

## 2018-01-20 MED ORDER — ASPIRIN 81 MG PO CHEW: 81.0000 mg | CHEWABLE_TABLET | Freq: Every day | ORAL | 0 refills | Status: AC

## 2018-01-20 NOTE — ED Triage Notes (Signed)
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

## 2018-01-20 NOTE — ED Provider Notes (Signed)
[facility] EMERGENCY DEPARTMENT Provider Note   CSN: [id number] Arrival date & time: 01/20/18  0746     History   Chief Complaint Chief Complaint  Patient presents with  . Chest Pain    HPI Paula Hardy is a 67 y.o. female with a history of osteoarthritis of the right hip who presents to the emergency department with a chief complaint of chest pain.  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  She reports that she had an associated episode of diaphoresis, headache, and nausea that lasted for approximately 30 minutes before resolving while the chest pain continued.  She states that she felt as if she might pass out during the episode.  No history of similar.  She does not know of any factors that made her symptoms better or worse.  She does not know of any factors that made her symptoms come on or resolve.  In the ED, all symptoms have resolved.  The patient drove herself to the ED in her personal vehicle.  She denies syncope, abdominal pain, vomiting, dysuria, hematuria, visual changes, tinnitus, URI symptoms.  She has been eating and drinking well over the last few days.  No known family history of cardiovascular disease.  She states that she was evaluated by a cardiologist for clearance prior to having her right hip surgery in 2017.  She states that she was supposed to start a medication after she was seen by the cardiologist, but has not been taking it.  The history is provided by the patient. No language interpreter was used.    Past Medical History:  Diagnosis Date  . Arthritis     Patient Active Problem List   Diagnosis Date Noted  . Varicose veins of bilateral lower extremities with other complications 07/14/2017  . Primary osteoarthritis of right hip 03/21/2016    Past Surgical History:  Procedure  Laterality Date  . BUNIONECTOMY Left   . JOINT REPLACEMENT Right   . ROTATOR CUFF REPAIR Left   . TOTAL HIP ARTHROPLASTY Right 03/21/2016   Procedure: RIGHT TOTAL HIP ARTHROPLASTY ANTERIOR APPROACH;  Surgeon: Samson Frederic, MD;  Location: [facility];  Service: Orthopedics;  Laterality: Right;  . UTERINE FIBROID SURGERY       OB History   None      Home Medications    Prior to Admission medications   Medication Sig Start Date End Date Taking? Authorizing Provider  aspirin-acetaminophen-caffeine (EXCEDRIN MIGRAINE) [phone] MG tablet Take 2 tablets by mouth every 6 (six) hours as needed for headache.   Yes [provider]  B COMPLEX-C-FOLIC ACID PO Take 1 tablet by mouth daily.   Yes [provider]  Biotin w/ Vitamins C & E (HAIR/SKIN/NAILS PO) Take 2,500 mg by mouth daily.   Yes [provider]  cyclobenzaprine (FLEXERIL) 5 MG tablet TAKE 1 TABLET(5 MG) BY MOUTH AT BEDTIME AS NEEDED FOR MUSCLE SPASMS 08/13/17  Yes Deeann Saint, MD  Multiple Vitamins-Minerals (MULTIVITAL) CHEW Chew 1 tablet by mouth daily.   Yes [provider]  Multiple Vitamins-Minerals-FA (ACTIVITE EC PO) Take 2 tablets by mouth daily.   Yes [provider]  naproxen sodium (ALEVE) 220 MG tablet Take 440 mg by mouth daily as needed (pain).   Yes [provider]  aspirin 81 MG chewable tablet Chew 1 tablet (  81 mg total) by mouth daily. 01/20/18   Nanette Wirsing A, PA-C  diclofenac sodium (VOLTAREN) 1 % GEL Apply 2 g topically 3 (three) times daily as needed. 05/22/17   Deeann Saint, MD    Family History Family History  Problem Relation Age of Onset  . Lung cancer Brother   . HIV/AIDS Brother     Social History Social History   Tobacco Use  . Smoking status: Former Smoker    Last attempt to quit: 03/14/1987    Years since quitting: 30.8  . Smokeless tobacco: Never Used  Substance Use Topics  . Alcohol use: Yes    Comment: occasional   . Drug use:  No     Allergies   Patient has no known allergies.   Review of Systems Review of Systems  Constitutional: Positive for diaphoresis. Negative for activity change, chills and fever.  HENT: Negative for congestion.   Eyes: Negative for visual disturbance.  Respiratory: Negative for shortness of breath.   Cardiovascular: Positive for chest pain. Negative for palpitations and leg swelling.  Gastrointestinal: Positive for nausea. Negative for abdominal pain, diarrhea and vomiting.  Genitourinary: Negative for dysuria.  Musculoskeletal: Negative for back pain.  Skin: Negative for rash.  Allergic/Immunologic: Negative for immunocompromised state.  Neurological: Positive for syncope (near), light-headedness and headaches. Negative for weakness and numbness.  Psychiatric/Behavioral: Negative for confusion.     Physical Exam Updated Vital Signs BP (!) 142/66   Pulse 62   Temp 98.1 F (36.7 C) (Oral)   Resp (!) 24   SpO2 96%   Physical Exam  Constitutional: No distress.  HENT:  Head: Normocephalic and atraumatic.  Eyes: Pupils are equal, round, and reactive to light. Conjunctivae and EOM are normal.  Neck: Neck supple. No hepatojugular reflux and no JVD present. No tracheal deviation present. No thyromegaly present.  Cardiovascular: Normal rate, regular rhythm, normal heart sounds and intact distal pulses. Exam reveals no gallop, no S3, no S4, no distant heart sounds and no friction rub.  No murmur heard. Pulses:      Carotid pulses are 2+ on the right side, and 2+ on the left side.      Radial pulses are 2+ on the right side, and 2+ on the left side.       Dorsalis pedis pulses are 2+ on the right side, and 2+ on the left side.  Pulmonary/Chest: Effort normal. No accessory muscle usage or stridor. No respiratory distress. She has no wheezes. She has no rales. She exhibits no tenderness.  Abdominal: Soft. She exhibits no distension and no mass. There is no tenderness. There is no  rebound and no guarding. No hernia.  Musculoskeletal: She exhibits no edema, tenderness or deformity.       Right lower leg: She exhibits no tenderness and no edema.       Left lower leg: She exhibits no tenderness and no edema.  Lymphadenopathy:    She has no cervical adenopathy.  Neurological: She is alert.  Skin: Skin is warm. No rash noted. She is not diaphoretic.  Psychiatric: Her behavior is normal.  Nursing note and vitals reviewed.  ED Treatments / Results  Labs (all labs ordered are listed, but only abnormal results are displayed) Labs Reviewed  BASIC METABOLIC PANEL  CBC  I-STAT TROPONIN, ED  I-STAT TROPONIN, ED    EKG EKG Interpretation  Date/Time:  Tuesday January 20 2018 10:06:00 EDT Ventricular Rate:  62 PR Interval:    QRS Duration:  73 QT Interval:  389 QTC Calculation: 395 R Axis:   63 Text Interpretation:  Sinus rhythm Atrial premature complexes in couplets Nonspecific T abnormalities, lateral leads no significant change since 2017 Confirmed by Pricilla LovelessGoldston, Scott [phone]) on 01/20/2018 10:16:50 AM Also confirmed by Pricilla LovelessGoldston, Scott [phone]), editor Barbette Hairassel, Kerry [phone]([id number])  on 01/20/2018 11:10:48 AM   Radiology Dg Chest 2 View  Result Date: 01/20/2018 CLINICAL DATA:  Left-sided chest pain, diaphoresis EXAM: CHEST - 2 VIEW COMPARISON:  Chest x-ray of 04/22/2017 FINDINGS: No active infiltrate or effusion is seen. Mediastinal and hilar contours are unremarkable. The heart is within normal limits in size. Only mild degenerative changes noted in the thoracic spine. IMPRESSION: No active cardiopulmonary disease. Electronically Signed   By: Dwyane DeePaul  Barry M.D.   On: 01/20/2018 08:51    Procedures Procedures (including critical care time)  Medications Ordered in ED Medications - No data to display   Initial Impression / Assessment and Plan / ED Course  I have reviewed the triage vital signs and the nursing notes.  Pertinent labs & imaging results that were available  during my care of the patient were reviewed by me and considered in my medical decision making (see chart for details).     67 year old female presenting with chest pain that radiated up the left side of the neck and down the upper left arm accompanied by an episode of headache, diaphoresis, nausea, and lightheadedness with near syncope that began this morning.  EKG with nonspecific T wave abnormalities, but no significant changes from previous.  Delta troponin is negative.  Chest x-ray is negative.  Labs are otherwise at the patient's baseline and reassuring.  Patient was discussed and evaluated along with Dr. Gwenlyn FudgeGoldstein, attending physician.  Given her history of being a previous smoker, obesity, age, and history regarding today's event, she has a moderate heart score.  She has never had an echo or stress test that she is aware of.  She is not established with cardiology.  Shared decision making conversation.  The patient is adamant that she does not want to be admitted for further work-up if her labs are negative today.  The patient was previously prescribed and 81 mg aspirin, which I suspect was from cardiology after her medical clearance for her hip surgery in 2017.  We will reinitiate this today.  We will also provide the patient with a referral to cardiology.  She has been given lengthy instructions and return precautions if she develops new or worsening symptoms.  She is also been advised to follow-up with cardiology on an outpatient basis in the next week or 2 with the most.  All questions have been answered for the patient and her grandson.  Prior to discharge, she is ambulatory, hemodynamically stable, and in no acute distress.  Strict return precautions given.  She is safe for discharge to home with outpatient cardiology follow-up at this time.   Final Clinical Impressions(s) / ED Diagnoses   Final diagnoses:  Atypical chest pain    ED Discharge Orders         Ordered    aspirin 81 MG  chewable tablet  Daily     01/20/18 1344           Aury Scollard A, PA-C 01/20/18 1809    Pricilla LovelessGoldston, Scott, MD 01/21/18 1510

## 2018-01-20 NOTE — Discharge Instructions (Signed)
Thank you for allowing me to care for you today in the Emergency Department.   Please call the cardiology office to schedule a follow up appointment. I would try to call them today or tomorrow to schedule an appointment for later this week.  Take one 81 mg aspirin daily. This is the medication the cardiologist started you on in 2017.  Return to the emergency department if you independent living because her husband and she was developed chest pain that radiates up your neck, with shortness of breath, with vomiting, sweating, pass out, or if you develop other new, concerning symptoms.

## 2018-01-28 ENCOUNTER — Other Ambulatory Visit: Payer: Self-pay | Admitting: Cardiology

## 2018-01-28 DIAGNOSIS — R079 Chest pain, unspecified: Secondary | ICD-10-CM

## 2018-01-31 ENCOUNTER — Encounter

## 2018-02-04 ENCOUNTER — Ambulatory Visit (HOSPITAL_COMMUNITY)
Admission: RE | Admit: 2018-02-04 | Discharge: 2018-02-04 | Disposition: A | Discharge status: Home / Self Care | Payer: Medicare Other | Source: Ambulatory Visit | Attending: Cardiology | Admitting: Cardiology

## 2018-02-04 DIAGNOSIS — R079 Chest pain, unspecified: Secondary | ICD-10-CM | POA: Diagnosis not present

## 2018-02-04 MED ORDER — TECHNETIUM TC 99M TETROFOSMIN IV KIT
10.0000 | PACK | Freq: Once | INTRAVENOUS | Status: AC | PRN
  Administered 2018-02-04: 10 via INTRAVENOUS

## 2018-02-04 MED ORDER — REGADENOSON 0.4 MG/5ML IV SOLN
0.4000 mg | Freq: Once | INTRAVENOUS | Status: AC
  Administered 2018-02-04: 0.4 mg via INTRAVENOUS

## 2018-02-04 MED ORDER — REGADENOSON 0.4 MG/5ML IV SOLN
INTRAVENOUS | Status: AC
  Filled 2018-02-04: qty 5

## 2018-02-16 ENCOUNTER — Other Ambulatory Visit: Payer: Self-pay | Admitting: Family Medicine

## 2018-02-16 DIAGNOSIS — Z1231 Encounter for screening mammogram for malignant neoplasm of breast: Secondary | ICD-10-CM

## 2018-02-19 IMAGING — DX DG CHEST 2V
2 series · 2 of 2 positions shown · non-contrast
Comparison: None.

CLINICAL DATA: Cough and congestion with shortness of breath

EXAM:
CHEST  2 VIEW

[chest pa]
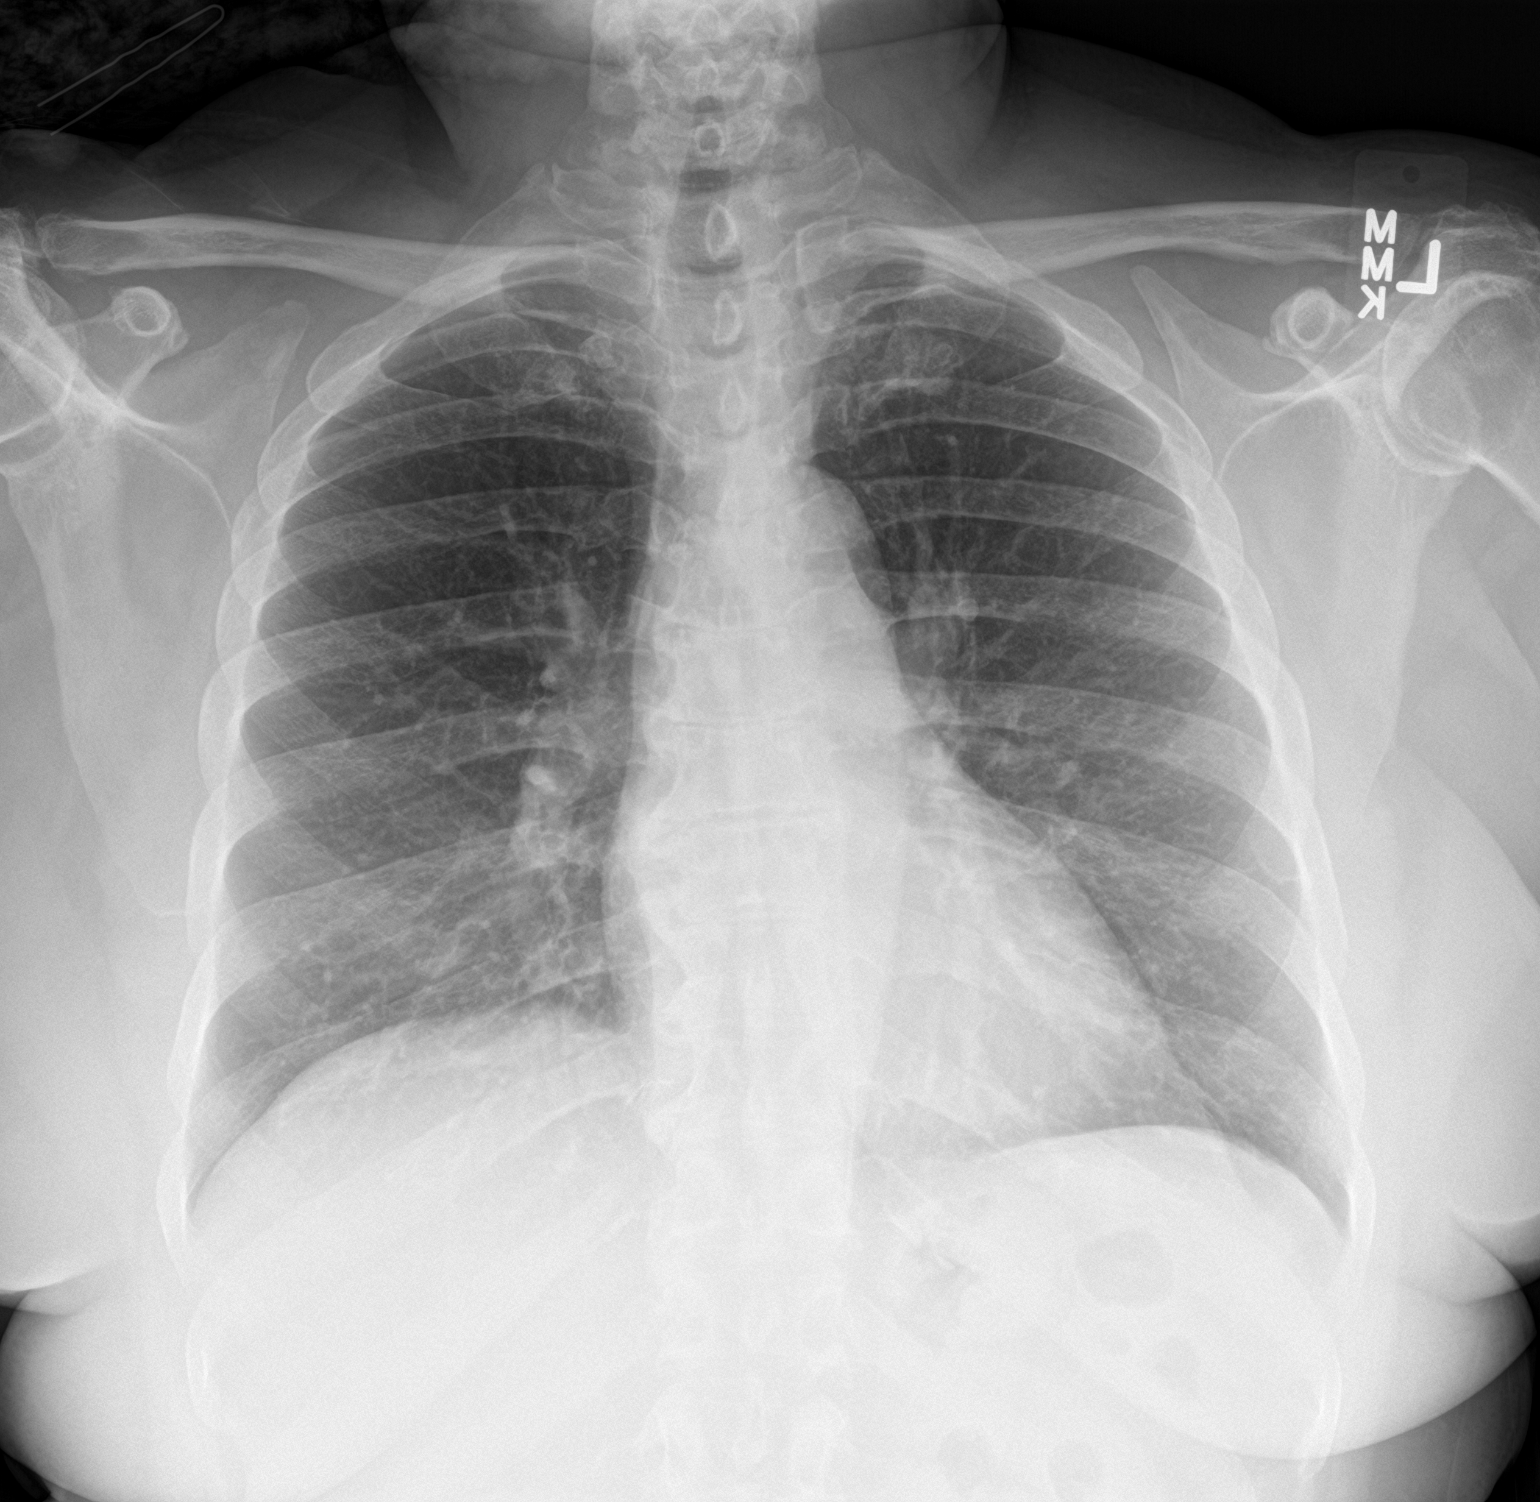

[chest lat]
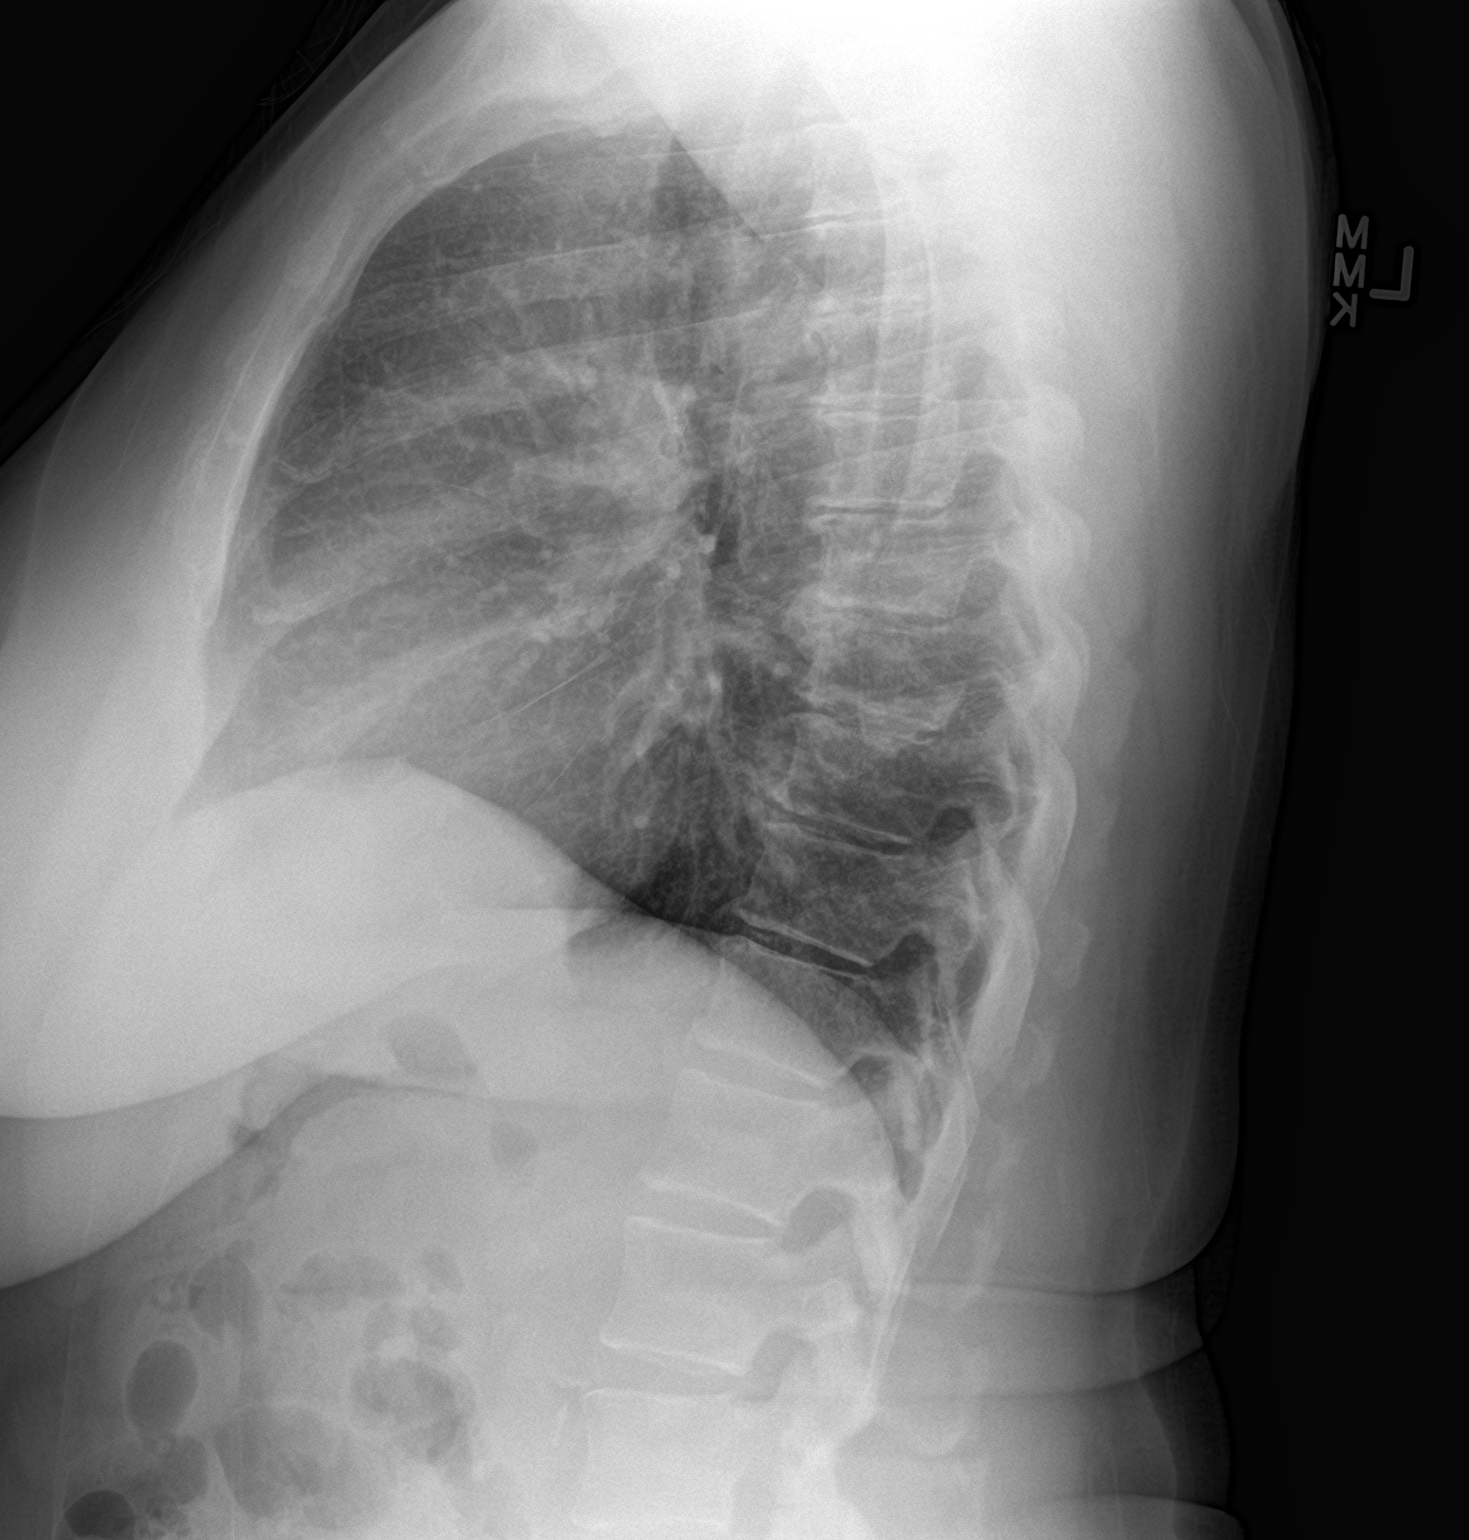

[2 of 2 positions shown; findings below may reference images not displayed]

FINDINGS: Lungs are clear. Heart size and pulmonary vascularity are normal. No
adenopathy. There is degenerative change in the thoracic spine.
IMPRESSION: No edema or consolidation.

## 2018-03-20 ENCOUNTER — Ambulatory Visit
Admission: RE | Admit: 2018-03-20 | Discharge: 2018-03-20 | Disposition: A | Discharge status: Home / Self Care | Payer: Medicare Other | Source: Ambulatory Visit | Attending: Family Medicine | Admitting: Family Medicine

## 2018-03-20 DIAGNOSIS — Z1231 Encounter for screening mammogram for malignant neoplasm of breast: Secondary | ICD-10-CM

## 2019-01-17 IMAGING — MG DIGITAL SCREENING BILATERAL MAMMOGRAM WITH TOMO AND CAD
8 series · 8 of 24 positions shown · non-contrast
Comparison: Previous exam(s).

ACR Breast Density Category a: The breast tissue is almost entirely
fatty.

CLINICAL DATA: Screening.

EXAM:
DIGITAL SCREENING BILATERAL MAMMOGRAM WITH TOMO AND CAD

[L CC synth-2D]
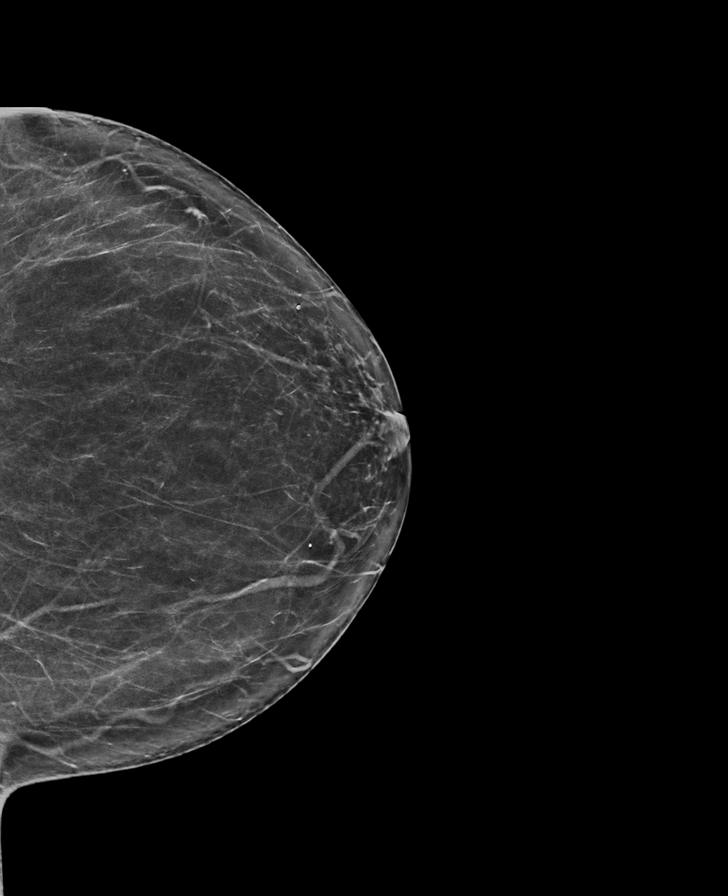

[R CC synth-2D]
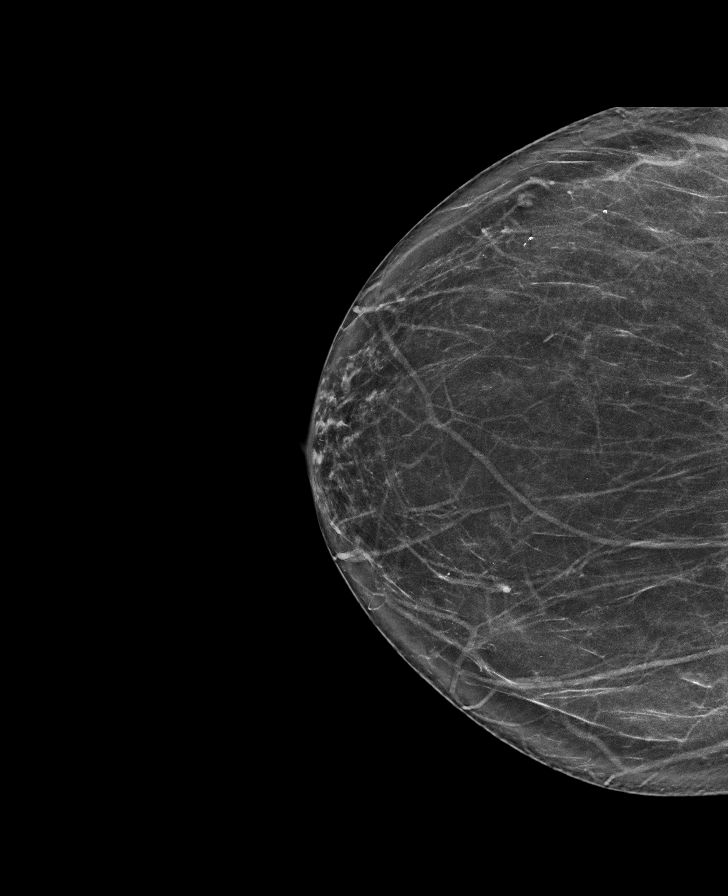

[L MLO synth-2D]
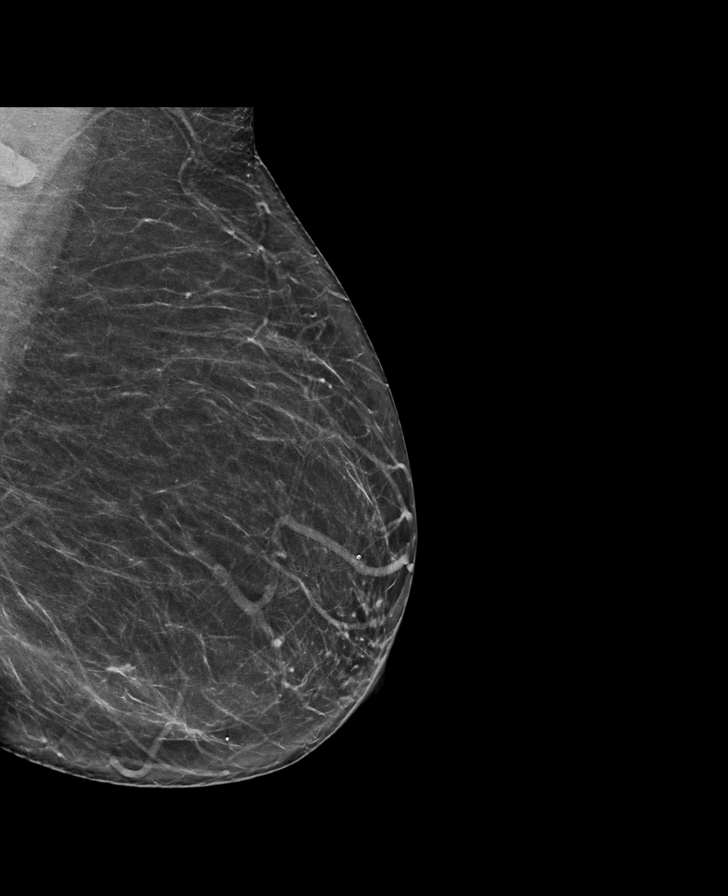

[R MLO synth-2D]
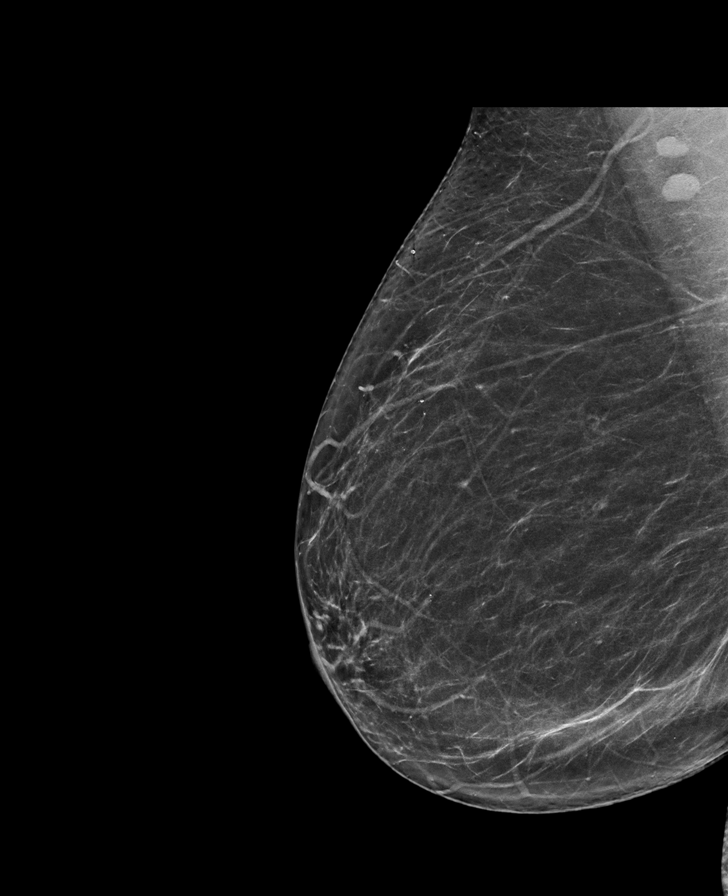

[L MLO tomo · tomo slice 37/74.0]
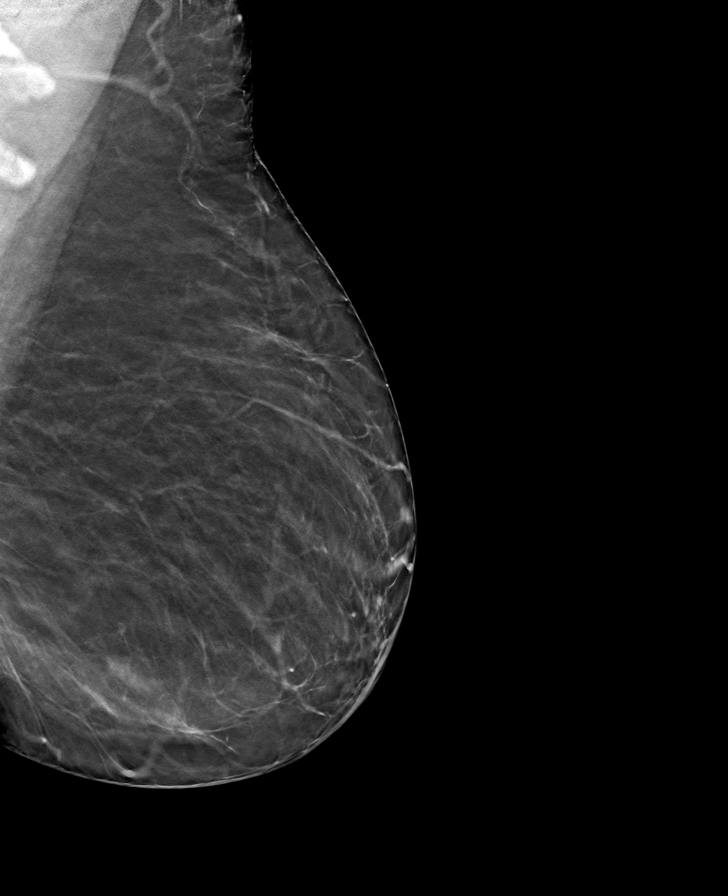

[R MLO tomo · tomo slice 39/77.0]
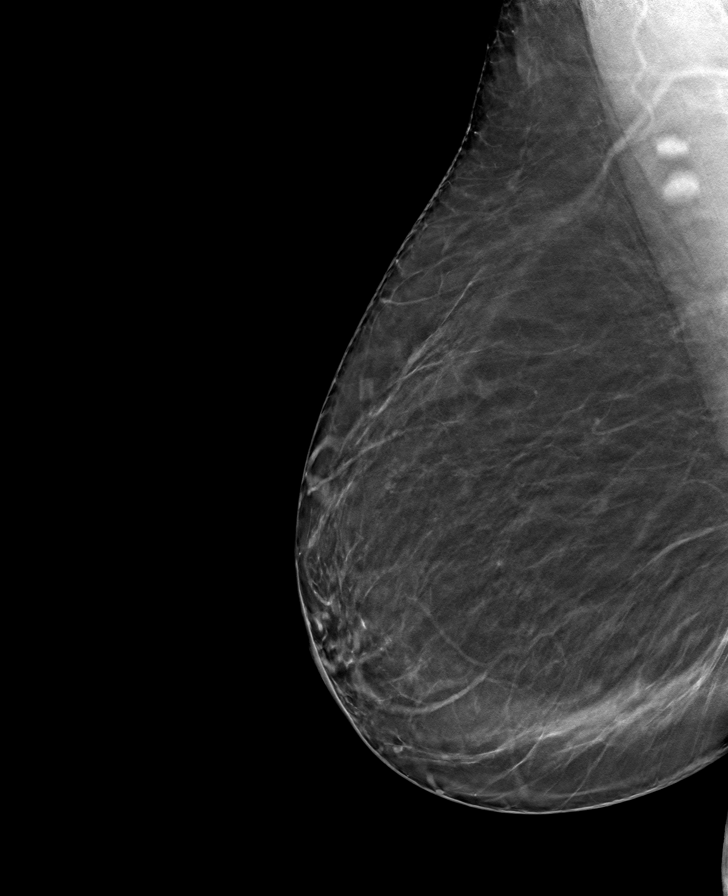

[R CC tomo · tomo slice 39/77.0]
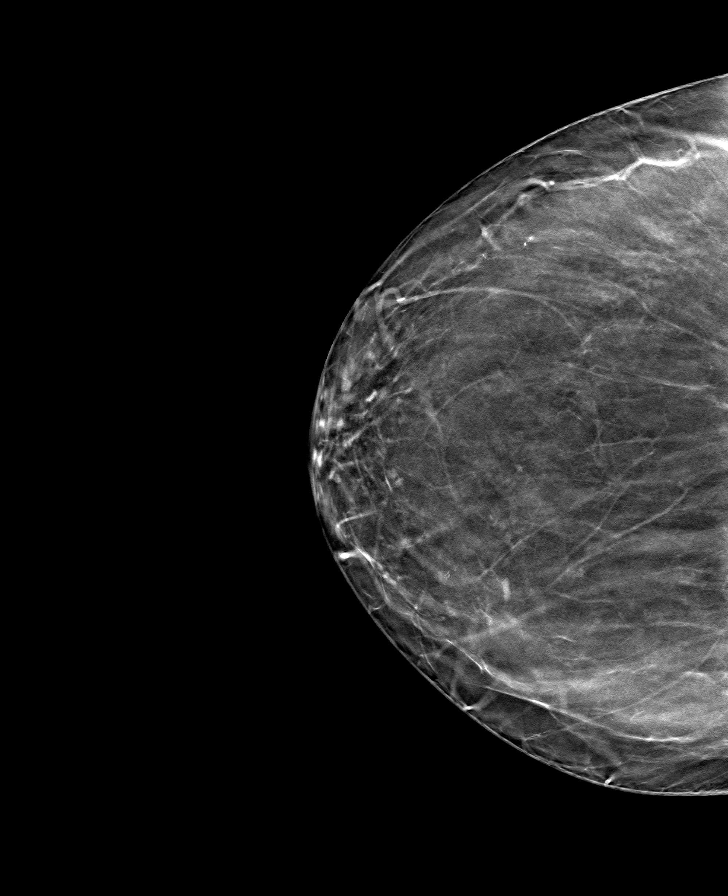

[L CC tomo · tomo slice 39/76.0]
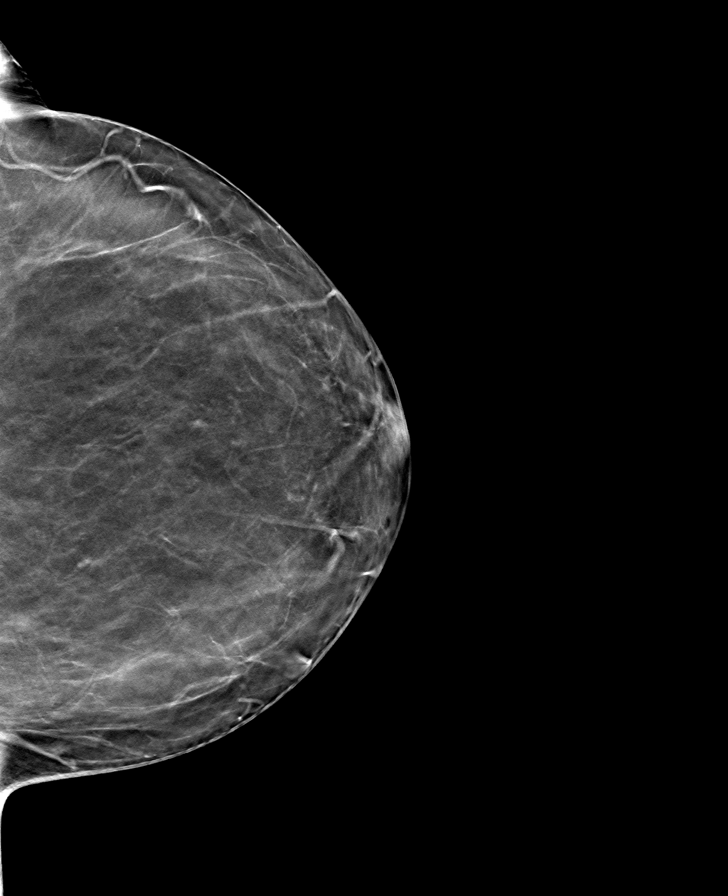

[8 of 24 positions shown; findings below may reference images not displayed]

FINDINGS: There are no findings suspicious for malignancy. Images were
processed with CAD.
IMPRESSION: No mammographic evidence of malignancy. A result letter of this
screening mammogram will be mailed directly to the patient.

RECOMMENDATION:
Screening mammogram in one year. (Code:8Y-Q-VVS)

BI-RADS CATEGORY  1: Negative.

## 2019-02-22 ENCOUNTER — Other Ambulatory Visit: Payer: Self-pay | Admitting: Physician Assistant

## 2019-02-22 DIAGNOSIS — Z1231 Encounter for screening mammogram for malignant neoplasm of breast: Secondary | ICD-10-CM

## 2019-04-12 ENCOUNTER — Other Ambulatory Visit: Payer: Self-pay

## 2019-04-12 ENCOUNTER — Ambulatory Visit
Admission: RE | Admit: 2019-04-12 | Discharge: 2019-04-12 | Disposition: A | Discharge status: Home / Self Care | Payer: Medicare Other | Source: Ambulatory Visit | Attending: Physician Assistant | Admitting: Physician Assistant

## 2019-04-12 DIAGNOSIS — Z1231 Encounter for screening mammogram for malignant neoplasm of breast: Secondary | ICD-10-CM

## 2019-12-16 ENCOUNTER — Other Ambulatory Visit: Payer: Self-pay | Admitting: Physician Assistant

## 2019-12-16 DIAGNOSIS — Z1231 Encounter for screening mammogram for malignant neoplasm of breast: Secondary | ICD-10-CM

## 2020-04-13 ENCOUNTER — Other Ambulatory Visit: Payer: Self-pay

## 2020-04-13 ENCOUNTER — Ambulatory Visit
Admission: RE | Admit: 2020-04-13 | Discharge: 2020-04-13 | Disposition: A | Discharge status: Home / Self Care | Payer: Medicare Other | Source: Ambulatory Visit | Attending: Physician Assistant | Admitting: Physician Assistant

## 2020-04-13 DIAGNOSIS — Z1231 Encounter for screening mammogram for malignant neoplasm of breast: Secondary | ICD-10-CM

## 2021-03-08 ENCOUNTER — Other Ambulatory Visit: Payer: Self-pay | Admitting: Physician Assistant

## 2021-03-08 DIAGNOSIS — Z1231 Encounter for screening mammogram for malignant neoplasm of breast: Secondary | ICD-10-CM

## 2021-04-16 ENCOUNTER — Ambulatory Visit
Admission: RE | Admit: 2021-04-16 | Discharge: 2021-04-16 | Disposition: A | Discharge status: Home / Self Care | Payer: Medicare Other | Source: Ambulatory Visit | Attending: Physician Assistant | Admitting: Physician Assistant

## 2021-04-16 DIAGNOSIS — Z1231 Encounter for screening mammogram for malignant neoplasm of breast: Secondary | ICD-10-CM

## 2022-03-13 ENCOUNTER — Other Ambulatory Visit: Payer: Self-pay | Admitting: Physician Assistant

## 2022-03-13 DIAGNOSIS — Z1231 Encounter for screening mammogram for malignant neoplasm of breast: Secondary | ICD-10-CM

## 2022-05-15 ENCOUNTER — Ambulatory Visit: Payer: Medicare Other

## 2022-05-17 ENCOUNTER — Ambulatory Visit
Admission: RE | Admit: 2022-05-17 | Discharge: 2022-05-17 | Disposition: A | Discharge status: Home / Self Care | Payer: Medicare Other | Source: Ambulatory Visit | Attending: Physician Assistant | Admitting: Physician Assistant

## 2022-05-17 DIAGNOSIS — Z1231 Encounter for screening mammogram for malignant neoplasm of breast: Secondary | ICD-10-CM

## 2023-04-25 ENCOUNTER — Other Ambulatory Visit: Payer: Self-pay | Admitting: Physician Assistant

## 2023-04-25 DIAGNOSIS — Z1231 Encounter for screening mammogram for malignant neoplasm of breast: Secondary | ICD-10-CM

## 2023-05-20 ENCOUNTER — Ambulatory Visit
Admission: RE | Admit: 2023-05-20 | Discharge: 2023-05-20 | Disposition: A | Discharge status: Home / Self Care | Payer: Medicare Other | Source: Ambulatory Visit | Attending: Physician Assistant | Admitting: Physician Assistant

## 2023-05-20 DIAGNOSIS — Z1231 Encounter for screening mammogram for malignant neoplasm of breast: Secondary | ICD-10-CM

## 2024-04-26 ENCOUNTER — Other Ambulatory Visit: Payer: Self-pay | Admitting: Physician Assistant

## 2024-04-26 DIAGNOSIS — Z1231 Encounter for screening mammogram for malignant neoplasm of breast: Secondary | ICD-10-CM

## 2024-05-28 ENCOUNTER — Ambulatory Visit
Admission: RE | Admit: 2024-05-28 | Discharge: 2024-05-28 | Disposition: A | Source: Ambulatory Visit | Attending: Physician Assistant | Admitting: Physician Assistant

## 2024-05-28 DIAGNOSIS — Z1231 Encounter for screening mammogram for malignant neoplasm of breast: Secondary | ICD-10-CM

## 2024-05-31 ENCOUNTER — Ambulatory Visit
